# Patient Record
Sex: Male | Born: 1960 | Race: White | Hispanic: No | Marital: Single | Smoking: Current every day smoker
Health system: Southern US, Community
[De-identification: ages and names within clinical notes are randomized; demographics above are authoritative.]

## PROBLEM LIST (undated history)

## (undated) DIAGNOSIS — F329 Major depressive disorder, single episode, unspecified: Secondary | ICD-10-CM

## (undated) DIAGNOSIS — F32A Depression, unspecified: Secondary | ICD-10-CM

## (undated) DIAGNOSIS — I729 Aneurysm of unspecified site: Secondary | ICD-10-CM

## (undated) HISTORY — DX: Aneurysm of unspecified site: I72.9

## (undated) HISTORY — PX: EYE SURGERY: SHX253

## (undated) HISTORY — PX: MOUTH SURGERY: SHX715

## (undated) HISTORY — DX: Major depressive disorder, single episode, unspecified: F32.9

## (undated) HISTORY — DX: Depression, unspecified: F32.A

---

## 2003-02-10 ENCOUNTER — Ambulatory Visit (HOSPITAL_COMMUNITY): Admission: RE | Admit: 2003-02-10 | Discharge: 2003-02-10 | Payer: Self-pay | Admitting: Ophthalmology

## 2003-03-03 ENCOUNTER — Ambulatory Visit (HOSPITAL_COMMUNITY): Admission: RE | Admit: 2003-03-03 | Discharge: 2003-03-03 | Payer: Self-pay | Admitting: Ophthalmology

## 2010-12-10 ENCOUNTER — Emergency Department (HOSPITAL_COMMUNITY)
Admission: EM | Admit: 2010-12-10 | Discharge: 2010-12-10 | Disposition: A | Discharge status: Home / Self Care | Payer: Self-pay | Attending: Emergency Medicine | Admitting: Emergency Medicine

## 2010-12-10 DIAGNOSIS — K5641 Fecal impaction: Secondary | ICD-10-CM | POA: Insufficient documentation

## 2010-12-10 DIAGNOSIS — K5900 Constipation, unspecified: Secondary | ICD-10-CM | POA: Insufficient documentation

## 2017-11-08 ENCOUNTER — Encounter (HOSPITAL_COMMUNITY): Payer: Self-pay | Admitting: Emergency Medicine

## 2017-11-08 ENCOUNTER — Other Ambulatory Visit: Payer: Self-pay

## 2017-11-08 ENCOUNTER — Emergency Department (HOSPITAL_COMMUNITY)
Admission: EM | Admit: 2017-11-08 | Discharge: 2017-11-08 | Disposition: A | Discharge status: Home / Self Care | Payer: Self-pay | Attending: Emergency Medicine | Admitting: Emergency Medicine

## 2017-11-08 DIAGNOSIS — F17210 Nicotine dependence, cigarettes, uncomplicated: Secondary | ICD-10-CM | POA: Insufficient documentation

## 2017-11-08 DIAGNOSIS — L0291 Cutaneous abscess, unspecified: Secondary | ICD-10-CM

## 2017-11-08 DIAGNOSIS — L0201 Cutaneous abscess of face: Secondary | ICD-10-CM | POA: Insufficient documentation

## 2017-11-08 MED ORDER — CLINDAMYCIN HCL 150 MG PO CAPS
300.0000 mg | ORAL_CAPSULE | Freq: Once | ORAL | Status: AC
  Administered 2017-11-08: 300 mg via ORAL
  Filled 2017-11-08: qty 2

## 2017-11-08 MED ORDER — CLINDAMYCIN HCL 300 MG PO CAPS: 300.0000 mg | ORAL_CAPSULE | Freq: Four times a day (QID) | ORAL | 0 refills | Status: AC

## 2017-11-08 MED ORDER — IBUPROFEN 600 MG PO TABS: 600.0000 mg | ORAL_TABLET | Freq: Four times a day (QID) | ORAL | 0 refills | Status: DC | PRN

## 2017-11-08 NOTE — ED Provider Notes (Signed)
[facility] EMERGENCY DEPARTMENT Provider Note   CSN: [id number] Arrival date & time: 11/08/17  1117     History   Chief Complaint Chief Complaint  Patient presents with  . Abscess    HPI Stephen Valentine is a 57 y.o. male.  The history is provided by the patient.  Abscess  Location:  Head/neck and face Facial abscess location:  Chin Size:  1 cm Abscess quality: draining, induration and painful   Abscess quality: no fluctuance and no itching   Progression:  Worsening Pain details:    Quality:  Aching   Severity:  Mild Chronicity:  Recurrent (Patient reports has had this symptom in the past which has resolved and now returned.) Context: not diabetes and not skin injury   Relieved by:  Draining/squeezing Worsened by:  Nothing Associated symptoms: no anorexia, no fever, no nausea and no vomiting   Risk factors: no family hx of MRSA and no hx of MRSA     No past medical history on file.  There are no active problems to display for this patient.   Past Surgical History:  Procedure Laterality Date  . MOUTH SURGERY         Home Medications    Prior to Admission medications   Medication Sig Start Date End Date Taking? Authorizing Provider  clindamycin (CLEOCIN) 300 MG capsule Take 1 capsule (300 mg total) by mouth 4 (four) times daily for 7 days. 11/08/17 11/15/17  Burgess Amor, PA-C  ibuprofen (ADVIL,MOTRIN) 600 MG tablet Take 1 tablet (600 mg total) by mouth every 6 (six) hours as needed for moderate pain. 11/08/17   Burgess Amor, PA-C    Family History No family history on file.  Social History Social History   Tobacco Use  . Smoking status: Current Every Day Smoker    Packs/day: 1.00    Types: Cigarettes  . Smokeless tobacco: Never Used  Substance Use Topics  . Alcohol use: No    Frequency: Never  . Drug use: No     Allergies   Penicillins   Review of Systems Review of Systems  Constitutional: Negative for chills and fever.  HENT: Negative.     Respiratory: Negative.   Gastrointestinal: Negative.  Negative for anorexia, nausea and vomiting.  Skin: Positive for wound.  Neurological: Negative for numbness.     Physical Exam Updated Vital Signs BP 102/82 (BP Location: Right Arm)   Pulse 87   Temp 98.4 F (36.9 C) (Oral)   Resp 20   Ht 5\' 10"  (1.778 m)   Wt 68 kg (150 lb)   SpO2 97%   BMI 21.52 kg/m   Physical Exam  Constitutional: He appears well-developed and well-nourished. No distress.  HENT:  Head: Normocephalic.  Mouth/Throat: No dental abscesses.  Poor dentition, mostly edentulous.  He has his inferior central and lateral incisors which have moderate decay.  There is no obvious dental abscess.  Neck: Neck supple.  Cardiovascular: Normal rate.  Pulmonary/Chest: Effort normal. He has no wheezes.  Musculoskeletal: Normal range of motion. He exhibits no edema.  Skin: No rash noted. There is erythema.  Open purulent draining small abscess on left chin within beard.  No fluctuance.     ED Treatments / Results  Labs (all labs ordered are listed, but only abnormal results are displayed) Labs Reviewed - No data to display  EKG  EKG Interpretation None       Radiology No results found.  Procedures Procedures (including critical care time)  Medications Ordered in ED Medications  clindamycin (CLEOCIN) capsule 300 mg (300 mg Oral Given 11/08/17 1303)     Initial Impression / Assessment and Plan / ED Course  I have reviewed the triage vital signs and the nursing notes.  Pertinent labs & imaging results that were available during my care of the patient were reviewed by me and considered in my medical decision making (see chart for details).     Patient with draining abscess, no indication for further I&D today.  He was placed on clindamycin and prescribed ibuprofen.  Patient has no insurance and transportation or ability for follow-up care outside of the ED.  He was given a referral to [facility] and also had case management give resource information to the patient including a voucher to get his medications filled today.  He was placed on clindamycin given his penicillin allergy.  First dose was given today.  Advised that he needs to shave his beard or at least the area around the site of infection so that he can keep it clean and continue to observe it.  Discussed warm water soaks along with soap and water wash.  Advised to follow-up here for recheck for any worsening symptoms.  Final Clinical Impressions(s) / ED Diagnoses   Final diagnoses:  Abscess    ED Discharge Orders        Ordered    clindamycin (CLEOCIN) 300 MG capsule  4 times daily     11/08/17 1348    ibuprofen (ADVIL,MOTRIN) 600 MG tablet  Every 6 hours PRN     11/08/17 1349       Burgess Amordol, Elwin Tsou, PA-C 11/08/17 1606    Long, Arlyss RepressJoshua G, MD 11/08/17 1941

## 2017-11-08 NOTE — ED Triage Notes (Signed)
Abscess on chin, 2 weeks ago, got yellow pus out, getting tight a tender again

## 2017-11-08 NOTE — Care Management (Signed)
[facility] and resources for follow [facility]

## 2017-11-08 NOTE — Discharge Instructions (Signed)
Take the entire course of the antibiotics prescribed. Apply a warm water soak using a washcloth for 3 times daily to this wound site.  You would benefit from shaving your beard so you can keep a closer eye on this infection and so you can more easily keep it clean.  You will need to wash the site with soap and warm water twice daily.

## 2018-01-22 NOTE — Congregational Nurse Program (Signed)
Congregational Nurse Program Note  Date of Encounter: 01/21/2018  Past Medical History: No past medical history on file.  Encounter Details: CNP Questionnaire - 01/21/18 1500      Questionnaire   Patient Status  Not Applicable    Race  White or Caucasian    Location Patient Served At  [facility]  Not Applicable    Uninsured  Uninsured (NEW 1x/quarter)    Food  No food insecurities    Housing/Utilities  Yes, have permanent housing    Transportation  Yes, need transportation assistance;Provided transportation assistance (bus pass, taxi voucher, etc.)    Interpersonal Safety  Yes, feel physically and emotionally safe where you currently live    Medication  Yes, have medication insecurities    Referrals  Other;Primary Care Provider/Clinic    ED Visit Averted  Not Applicable    Life-Saving Intervention Made  Not Applicable       New client to [facility] today as a walk in. Client was last seen in [facility] room in January 2019. He was seen and treated for dental pain and abscess. He was treated by antibiotics and assisted with payment through [facility] match program. Client states he completed the medication, but now is having "twinges" of pain in his mouth. Noted from ER note that client has multiple areas of decay of his teeth.  Social: client does not have income currently and does not have health insurance. Client does receive food stamps. He is currently living in a camper [place] his [place] property. He reports he has running water and a place to cook with hot plate and microwave. He states he does have heat from a portable electric heater. Client does report feeling safe where he is living at this time. Client's mode of transportation is mainly by bicycle. He is here today requesting help navigating into a primary care provider where he can get care for his health and dental care.   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 Mental health    Alert and oriented to person,place and time. Answers questions appropriately but vague on some issues and doesn't remember. He has traveled around.  Chief complaints today are dental pain that he reports as twinges. Recent treatment of dental abscess in 11/2017 Complains of exhaustion, dizziness, insomnia. Blood pressure today 105/68, pulse 82, glucose 80. ( has only eaten a snack cake today). Question if client is eating properly. No labs available in Epic for review. Denies Chest pains or shortness of breath. Lungs clear. Respirations non labored at 12. Denies abdominal pain at present. Client offered water and crackers while here and declined. Will send some home with client today. Does report lower back pain that at times radiates down both legs, he states he has had this issue for a while. He reports swelling of his feet with long periods of standing.  Discussed with client about options for medical care that would then be able to refer him for dental care and length of time that would take. Client states understanding and would like to proceed with referral to the [facility] and appointment secured for next week to allow for [facility] transportation. Appointment secured for 01/29/18 at 1:00 PM. [facility] arranged for medical appointment and will pick client up at 1130 am on 01/29/18. Client also referred to Social work intern Shanon Payor who will arrange to meet with client  after his medical appointment at the [facility] clinic on 01/29/18. Social work Tax inspectorintern to address: Camera operatorhone application with [employer] stamps Housing needs.  Mental health needs and assessment.  Client agreeable to plan and will follow as needed.

## 2018-01-29 ENCOUNTER — Encounter: Payer: Self-pay | Admitting: Physician Assistant

## 2018-01-29 ENCOUNTER — Ambulatory Visit: Payer: Self-pay | Admitting: Physician Assistant

## 2018-01-29 VITALS — BP 127/77 | HR 64 | Temp 98.2°F | Ht 69.0 in | Wt 145.5 lb

## 2018-01-29 DIAGNOSIS — Z125 Encounter for screening for malignant neoplasm of prostate: Secondary | ICD-10-CM

## 2018-01-29 DIAGNOSIS — F17219 Nicotine dependence, cigarettes, with unspecified nicotine-induced disorders: Secondary | ICD-10-CM

## 2018-01-29 DIAGNOSIS — Z131 Encounter for screening for diabetes mellitus: Secondary | ICD-10-CM

## 2018-01-29 DIAGNOSIS — Z13220 Encounter for screening for lipoid disorders: Secondary | ICD-10-CM

## 2018-01-29 DIAGNOSIS — K029 Dental caries, unspecified: Secondary | ICD-10-CM

## 2018-01-29 DIAGNOSIS — R634 Abnormal weight loss: Secondary | ICD-10-CM

## 2018-01-29 DIAGNOSIS — R1310 Dysphagia, unspecified: Secondary | ICD-10-CM

## 2018-01-29 MED ORDER — OMEPRAZOLE 40 MG PO CPDR: 40.0000 mg | DELAYED_RELEASE_CAPSULE | Freq: Every day | ORAL | 1 refills | Status: DC

## 2018-01-29 NOTE — Patient Instructions (Addendum)
1. Get fasting labwork done. 2. Medication (omeprazole) will come in the mail 3. [facility] in [facility] 4. Stop smoking 5. Gastroenterology office will be contacting you for an appointment 6. Bring back Letter of Support and [place] bill for proof of address  ________________________________________________________    Steps to Quit Smoking Smoking tobacco can be harmful to your health and can affect almost every organ in your body. Smoking puts you, and those around you, at risk for developing many serious chronic diseases. Quitting smoking is difficult, but it is one of the best things that you can do for your health. It is never too late to quit. What are the benefits of quitting smoking? When you quit smoking, you lower your risk of developing serious diseases and conditions, such as:  Lung cancer or lung disease, such as COPD.  Heart disease.  Stroke.  Heart attack.  Infertility.  Osteoporosis and bone fractures.  Additionally, symptoms such as coughing, wheezing, and shortness of breath may get better when you quit. You may also find that you get sick less often because your body is stronger at fighting off colds and infections. If you are pregnant, quitting smoking can help to reduce your chances of having a baby of low birth weight. How do I get ready to quit? When you decide to quit smoking, create a plan to make sure that you are successful. Before you quit:  Pick a date to quit. Set a date within the next two weeks to give you time to prepare.  Write down the reasons why you are quitting. Keep this list in places where you will see it often, such as on your bathroom mirror or in your car or wallet.  Identify the people, places, things, and activities that make you want to smoke (triggers) and avoid them. Make sure to take these actions: ? Throw away all cigarettes at home, at work, and in your car. ? Throw away smoking accessories, such as Air traffic controllerashtrays  and lighters. ? Clean your car and make sure to empty the ashtray. ? Clean your home, including curtains and carpets.  Tell your family, friends, and coworkers that you are quitting. Support from your loved ones can make quitting easier.  Talk with your health care provider about your options for quitting smoking.  Find out what treatment options are covered by your health insurance.  What strategies can I use to quit smoking? Talk with your healthcare provider about different strategies to quit smoking. Some strategies include:  Quitting smoking altogether instead of gradually lessening how much you smoke over a period of time. Research shows that quitting "cold [place]urkey" is more successful than gradually quitting.  Attending in-person counseling to help you build problem-solving skills. You are more likely to have success in quitting if you attend several counseling sessions. Even short sessions of 10 minutes can be effective.  Finding resources and support systems that can help you to quit smoking and remain smoke-free after you quit. These resources are most helpful when you use them often. They can include: ? Online chats with a Veterinary surgeoncounselor. ? Telephone quitlines. ? Automotive engineerrinted self-help materials. ? Support groups or group counseling. ? Text messaging programs. ? Mobile phone applications.  Taking medicines to help you quit smoking. (If you are pregnant or breastfeeding, talk with your health care provider first.) Some medicines contain nicotine and some do not. Both types of medicines help with cravings, but the medicines that include nicotine help to relieve withdrawal  symptoms. Your health care provider may recommend: ? Nicotine patches, gum, or lozenges. ? Nicotine inhalers or sprays. ? Non-nicotine medicine that is taken by mouth.  Talk with your health care provider about combining strategies, such as taking medicines while you are also receiving in-person counseling. Using these  two strategies together makes you more likely to succeed in quitting than if you used either strategy on its own. If you are pregnant or breastfeeding, talk with your health care provider about finding counseling or other support strategies to quit smoking. Do not take medicine to help you quit smoking unless told to do so by your health care provider. What things can I do to make it easier to quit? Quitting smoking might feel overwhelming at first, but there is a lot that you can do to make it easier. Take these important actions:  Reach out to your family and friends and ask that they support and encourage you during this time. Call telephone quitlines, reach out to support groups, or work with a counselor for support.  Ask people who smoke to avoid smoking around you.  Avoid places that trigger you to smoke, such as bars, parties, or smoke-break areas at work.  Spend time around people who do not smoke.  Lessen stress in your life, because stress can be a smoking trigger for some people. To lessen stress, try: ? Exercising regularly. ? Deep-breathing exercises. ? Yoga. ? Meditating. ? Performing a body scan. This involves closing your eyes, scanning your body from head to toe, and noticing which parts of your body are particularly tense. Purposefully relax the muscles in those areas.  Download or purchase mobile phone or tablet apps (applications) that can help you stick to your quit plan by providing reminders, tips, and encouragement. There are many free apps, such as QuitGuide from the [employer] developer for Disease Control and Prevention). You can find other support for quitting smoking (smoking cessation) through smokefree.gov and other websites.  How will I feel when I quit smoking? Within the first 24 hours of quitting smoking, you may start to feel some withdrawal symptoms. These symptoms are usually most noticeable 2-3 days after quitting, but they usually do not last beyond 2-3 weeks.  Changes or symptoms that you might experience include:  Mood swings.  Restlessness, anxiety, or irritation.  Difficulty concentrating.  Dizziness.  Strong cravings for sugary foods in addition to nicotine.  Mild weight gain.  Constipation.  Nausea.  Coughing or a sore throat.  Changes in how your medicines work in your body.  A depressed mood.  Difficulty sleeping (insomnia).  After the first 2-3 weeks of quitting, you may start to notice more positive results, such as:  Improved sense of smell and taste.  Decreased coughing and sore throat.  Slower heart rate.  Lower blood pressure.  Clearer skin.  The ability to breathe more easily.  Fewer sick days.  Quitting smoking is very challenging for most people. Do not get discouraged if you are not successful the first time. Some people need to make many attempts to quit before they achieve long-term success. Do your best to stick to your quit plan, and talk with your health care provider if you have any questions or concerns. This information is not intended to replace advice given to you by your health care provider. Make sure you discuss any questions you have with your health care provider. Document Released: 10/17/2001 Document Revised: 06/20/2016 Document Reviewed: 03/09/2015 Elsevier Interactive Patient Education  2018  [employer].

## 2018-01-29 NOTE — Progress Notes (Signed)
BP 127/77 (BP Location: Right Arm, Patient Position: Sitting, Cuff Size: Normal)   Pulse 64   Temp 98.2 F (36.8 C)   Ht 5\' 9"  (1.753 m)   Wt 145 lb 8 oz (66 kg)   SpO2 98%   BMI 21.49 kg/m    Subjective:    Patient ID: Stephen Valentine, male    DOB: [birth year], 57 y.o.   MRN: [id number]  HPI: Stephen Valentine is a 57 y.o. male presenting on 01/29/2018 for New Patient (Initial Visit)   HPI   Chief Complaint  Patient presents with  . New Patient (Initial Visit)      Pt having dysphagia for several years- wax and wane- getting worse.  He says his voice feels like it is slurring- he doesn't know if that is due to his throat or his teeth.  He says food feels like it's getting stuck in his throat    Pt moved back to [facility] 3 years ago from [place].      Pt also complains of chronic insomnia.   Pt states weight loss of about 85 pounds over past few years.  He says he used to weigh 230 pounds.  He has not been trying to lose weight.   Pt was in hospital for aneurysm approximately 2005 in [place].  He doesn't know if it was in his brain or abdomen or where.  He doesn't even know what town in [place] he was.   He doesn't think he had surgery for it.   Pt started smoking when he was 57yo.   He will have some wheezes occasionally in the morning upon rising but otherwise denies wheezes, sob.     Relevant past medical, surgical, family and social history reviewed and updated as indicated. Interim medical history since our last visit reviewed. Allergies and medications reviewed and updated.  No current outpatient medications on file.   Review of Systems  Constitutional: Positive for fatigue. Negative for appetite change, chills, diaphoresis, fever and unexpected weight change.  HENT: Positive for dental problem and trouble swallowing. Negative for congestion, drooling, ear pain, facial swelling, hearing loss, mouth sores, sneezing, sore throat and voice change.   Eyes:  Negative for pain, discharge, redness, itching and visual disturbance.  Respiratory: Negative for cough, choking, shortness of breath and wheezing.   Cardiovascular: Negative for chest pain, palpitations and leg swelling.  Gastrointestinal: Negative for abdominal pain, blood in stool, constipation, diarrhea and vomiting.  Endocrine: Positive for cold intolerance and heat intolerance. Negative for polydipsia.  Genitourinary: Negative for decreased urine volume, dysuria and hematuria.  Musculoskeletal: Positive for arthralgias and back pain. Negative for gait problem.  Skin: Negative for rash.  Allergic/Immunologic: Negative for environmental allergies.  Neurological: Positive for light-headedness. Negative for seizures, syncope and headaches.  Hematological: Negative for adenopathy.  Psychiatric/Behavioral: Negative for agitation, dysphoric mood and suicidal ideas. The patient is not nervous/anxious.     Per HPI unless specifically indicated above     Objective:    BP 127/77 (BP Location: Right Arm, Patient Position: Sitting, Cuff Size: Normal)   Pulse 64   Temp 98.2 F (36.8 C)   Ht 5\' 9"  (1.753 m)   Wt 145 lb 8 oz (66 kg)   SpO2 98%   BMI 21.49 kg/m   Wt Readings from Last 3 Encounters:  01/29/18 145 lb 8 oz (66 kg)  01/21/18 150 lb (68 kg)  11/08/17 150 lb (68 kg)    Physical Exam  Constitutional: He is oriented to person, place, and time. Vital signs are normal. He is cooperative.  Thin frail pleasant gentleman who appears older than stated age.  Very strong smell of cigarettes.  HENT:  Head: Normocephalic and atraumatic.  Right Ear: A foreign body is present.  Left Ear: A foreign body is present.  Nose: Nasal deformity present.  Mouth/Throat: Oropharynx is clear and moist. No trismus in the jaw. Abnormal dentition. No uvula swelling. No oropharyngeal exudate.  Cerumen bilaterlally.  Only teeth present are lower front teeth which are all significantly decayed. No abscess  seen.    Eyes: Pupils are equal, round, and reactive to light. Conjunctivae and EOM are normal.  Neck: Neck supple. No thyromegaly present.  Cardiovascular: Normal rate and regular rhythm.  Pulmonary/Chest: Effort normal and breath sounds normal. No respiratory distress. He has no wheezes. He has no rales.  Abdominal: Soft. Bowel sounds are normal. He exhibits no distension, no fluid wave, no ascites, no pulsatile midline mass and no mass. There is no hepatosplenomegaly. There is no tenderness.  Musculoskeletal: He exhibits no edema.  Lymphadenopathy:    He has no cervical adenopathy.  Neurological: He is alert and oriented to person, place, and time.  Skin: Skin is warm and dry. No rash noted.  Psychiatric: He has a normal mood and affect. His behavior is normal. Thought content normal. His speech is slurred.  Pt talks through his nose somewhat.  He doesn't know if he slurs due to his teeth or his nose issue.   Vitals reviewed.    No results found for this or any previous visit.    Assessment & Plan:   Encounter Diagnoses  Name Primary?  Stephen Valentine. Dysphagia, unspecified type Yes  . Weight loss   . Cigarette nicotine dependence with nicotine-induced disorder   . Screening cholesterol level   . Screening for diabetes mellitus   . Screening for prostate cancer   . Dental decay      -will get Baseline labs -rx Omeprazole -will sign up pt for medassist since he is unable to afford his medications -Refer to gastroenterology for dysphagia x 3 years with weight loss and long history of smoking -pt was [facility] -pt was put on Dental list -counseled pt on Smoking cessation -pt to follow up here 1 month.  RTO sooner prn

## 2018-01-30 ENCOUNTER — Telehealth: Payer: Self-pay

## 2018-01-30 ENCOUNTER — Encounter: Payer: Self-pay | Admitting: Internal Medicine

## 2018-01-30 NOTE — Telephone Encounter (Signed)
[facility] clinic called after client's appointment. Client has an upcoming appointment for follow up with the [facility] on 03/05/18 at 2:00 PM. [facility] arranged by RN and will pick up client at or around 1:00 PM.  Client also in need of lab work. [facility] arranged for 02/06/18 pickup at 0800 to take to [facility] for [place] at 0900.   The [facility] clinic staff Stephen Valentine wrote down all appointment dates for medical and lab with specific pick up times for [facility] at gave to the client before he left the clinic. Client does not currently have a phone.    *NOTE . CSWEI  BSW intern Stephen Valentine met with client after his appointment at the [facility] clinic for needs and risk assessment. BSW intern also assisted client in applying for a lifeline phone through [employer]. Application completed online with client and phone should be delivered to his home within the next couple of weeks.    [place] [phone]

## 2018-01-30 NOTE — Telephone Encounter (Signed)
[facility]. While pt was on the phone asked pt how his appointment with the [facility] went. Pt stated it went well. Says he has his labs will be drawn next week. Aplogized to pt about [facility] not taking him home yesterday due to incorrect address. Told pt we have corrected his address at [facility], and should have no issues next week. Pt thankful.   Lougenia Morrissey R. Guthrie Lemme LPN [phone]

## 2018-02-06 ENCOUNTER — Other Ambulatory Visit (HOSPITAL_COMMUNITY)
Admission: RE | Admit: 2018-02-06 | Discharge: 2018-02-06 | Disposition: A | Discharge status: Home / Self Care | Payer: Self-pay | Source: Ambulatory Visit | Attending: Physician Assistant | Admitting: Physician Assistant

## 2018-02-06 DIAGNOSIS — R634 Abnormal weight loss: Secondary | ICD-10-CM | POA: Insufficient documentation

## 2018-02-06 DIAGNOSIS — R1310 Dysphagia, unspecified: Secondary | ICD-10-CM | POA: Insufficient documentation

## 2018-02-06 DIAGNOSIS — Z13220 Encounter for screening for lipoid disorders: Secondary | ICD-10-CM | POA: Insufficient documentation

## 2018-02-06 DIAGNOSIS — Z125 Encounter for screening for malignant neoplasm of prostate: Secondary | ICD-10-CM | POA: Insufficient documentation

## 2018-02-06 DIAGNOSIS — Z131 Encounter for screening for diabetes mellitus: Secondary | ICD-10-CM | POA: Insufficient documentation

## 2018-02-06 LAB — COMPREHENSIVE METABOLIC PANEL
ALBUMIN: 4.4 g/dL (ref 3.5–5.0)
ALT: 21 U/L (ref 17–63)
AST: 27 U/L (ref 15–41)
Alkaline Phosphatase: 62 U/L (ref 38–126)
Anion gap: 12 (ref 5–15)
BUN: 9 mg/dL (ref 6–20)
CHLORIDE: 102 mmol/L (ref 101–111)
CO2: 28 mmol/L (ref 22–32)
CREATININE: 1.24 mg/dL (ref 0.61–1.24)
Calcium: 9.8 mg/dL (ref 8.9–10.3)
GFR calc Af Amer: >60 mL/min (ref >60)
GFR calc non Af Amer: >60 mL/min (ref >60)
Glucose, Bld: 96 mg/dL (ref 65–99)
Potassium: 4.5 mmol/L (ref 3.5–5.1)
Sodium: 142 mmol/L (ref 135–145)
Total Bilirubin: 1.2 mg/dL (ref 0.3–1.2)
Total Protein: 7.9 g/dL (ref 6.5–8.1)

## 2018-02-06 LAB — CBC WITH DIFFERENTIAL/PLATELET
Basophils Absolute: 0 10*3/uL (ref 0.0–0.1)
Basophils Relative: 1 %
EOS ABS: 0.2 10*3/uL (ref 0.0–0.7)
Eosinophils Relative: 3 %
HCT: 49.8 % (ref 39.0–52.0)
HEMOGLOBIN: 16.3 g/dL (ref 13.0–17.0)
LYMPHS ABS: 2 10*3/uL (ref 0.7–4.0)
LYMPHS PCT: 30 %
MCH: 30.9 pg (ref 26.0–34.0)
MCHC: 32.7 g/dL (ref 30.0–36.0)
MCV: 94.5 fL (ref 78.0–100.0)
Monocytes Absolute: 0.5 10*3/uL (ref 0.1–1.0)
Monocytes Relative: 7 %
Neutro Abs: 4 10*3/uL (ref 1.7–7.7)
Neutrophils Relative %: 59 %
Platelets: 169 10*3/uL (ref 150–400)
RBC: 5.27 MIL/uL (ref 4.22–5.81)
RDW: 13.2 % (ref 11.5–15.5)
WBC: 6.7 10*3/uL (ref 4.0–10.5)

## 2018-02-06 LAB — LIPID PANEL
CHOLESTEROL: 199 mg/dL (ref 0–200)
HDL: 68 mg/dL (ref >40)
LDL Cholesterol: 125 mg/dL — ABNORMAL HIGH (ref 0–99)
Total CHOL/HDL Ratio: 2.9 RATIO
Triglycerides: 31 mg/dL (ref <150)
VLDL: 6 mg/dL (ref 0–40)

## 2018-02-06 LAB — TSH: TSH: 2.843 u[IU]/mL (ref 0.350–4.500)

## 2018-02-06 LAB — PSA: Prostatic Specific Antigen: 0.71 ng/mL (ref 0.00–4.00)

## 2018-02-06 LAB — HEMOGLOBIN A1C
HEMOGLOBIN A1C: 5.4 % (ref 4.8–5.6)
MEAN PLASMA GLUCOSE: 108.28 mg/dL

## 2018-02-13 NOTE — Congregational Nurse Program (Signed)
Congregational Nurse Program Note  Date of Encounter: 02/13/2018  Past Medical History: Past Medical History:  Diagnosis Date  . Aneurysm (HCC)   . Depression     Encounter Details: CNP Questionnaire - 02/13/18 1603      Questionnaire   Patient Status  Not Applicable    Race  White or Caucasian    [facility]    Insurance  Not Applicable    Uninsured  Uninsured (Subsequent visits/quarter)    Food  No food insecurities    Housing/Utilities  Yes, have permanent housing    Transportation  Yes, need transportation assistance;Provided transportation assistance (bus pass, taxi voucher, etc.)    Interpersonal Safety  Yes, feel physically and emotionally safe where you currently live    Medication  Yes, have medication insecurities    Medical Provider  Yes    Referrals  Other;[facility]    ED Visit Averted  Not Applicable    Life-Saving Intervention Made  Not Applicable       Client walked in today inquiring about Assurance phone that [employer]ander Scott BSW intern had assisted him with application.  RN explained that client had accidentally given the wrong address and that the application had to be canceled and then reapply. Earley AbideBlanca Rangel LPN started another application for a phone today online with client and Shanon PayorSander Scott BSW intern will follow up regarding this next week by phone. Client agreeable.  Client also needed transportation for upcoming GI appointment. For 04/11/18. [facility] called and transportation arranged and pickup time written on patients appointment letter.  Verified previously arranged transportation with [facility] for upcoming appointments.  Client also had attempted to meet with financial counselor Knox RoyaltyIngrid Costner [facility] but states she was not there. Client had rode bicycle from [place] area to town to see her. RN called Knox RoyaltyIngrid Costner and she will see client today if he will come by. Client informed to knock on her door  and let her know his name. Client agreeable.  No further needs at this time. Will follow as needed.  Francee NodalPatricia R Axton Cihlar RN [phone]

## 2018-03-05 ENCOUNTER — Ambulatory Visit: Payer: Self-pay | Admitting: Physician Assistant

## 2018-03-05 ENCOUNTER — Encounter: Payer: Self-pay | Admitting: Physician Assistant

## 2018-03-05 VITALS — BP 90/62 | HR 71 | Temp 97.9°F | Ht 69.0 in | Wt 142.8 lb

## 2018-03-05 DIAGNOSIS — R634 Abnormal weight loss: Secondary | ICD-10-CM | POA: Insufficient documentation

## 2018-03-05 DIAGNOSIS — M545 Low back pain, unspecified: Secondary | ICD-10-CM

## 2018-03-05 DIAGNOSIS — K029 Dental caries, unspecified: Secondary | ICD-10-CM

## 2018-03-05 DIAGNOSIS — R195 Other fecal abnormalities: Secondary | ICD-10-CM | POA: Insufficient documentation

## 2018-03-05 DIAGNOSIS — F17219 Nicotine dependence, cigarettes, with unspecified nicotine-induced disorders: Secondary | ICD-10-CM | POA: Insufficient documentation

## 2018-03-05 DIAGNOSIS — R1310 Dysphagia, unspecified: Secondary | ICD-10-CM | POA: Insufficient documentation

## 2018-03-05 DIAGNOSIS — E785 Hyperlipidemia, unspecified: Secondary | ICD-10-CM | POA: Insufficient documentation

## 2018-03-05 NOTE — Progress Notes (Signed)
BP 90/62 (BP Location: Left Arm, Patient Position: Sitting, Cuff Size: Normal)   Pulse 71   Temp 97.9 F (36.6 C)   Ht  (1.753 m)   Wt 142 lb 12 oz (64.8 kg)   SpO2 97%   BMI 21.08 kg/m    Subjective:    Patient ID: Stephen Valentine, male    DOB: 1961-10-23, 57 y.o.   MRN: [id number]  HPI: Stephen Valentine is a 57 y.o. male presenting on 03/05/2018 for Follow-up   HPI   Pt has appt with GI first week of June for dyspepsia, weight loss and + occult fecal blood.    Pt is taking the omeprazole and says that is helping some.   Pt is still smoking.  He still has some SOB that comes and goes but says he mostly has SOB when he is trying to eat or drink.   Pt says he has a place above his rectum that hurts from time to time.  It has been there for several years- he says it comes and goes.  He says he had some xrays for that in [place] one time but he doesn't know what they showed.    Pt has not yet submitted [facility].  He says he is waiting for some tax papers  Pt has lost 3 more pounds since his initial appointment a month ago.  Relevant past medical, surgical, family and social history reviewed and updated as indicated. Interim medical history since our last visit reviewed. Allergies and medications reviewed and updated.   Current Outpatient Medications:  .  omeprazole (PRILOSEC) 40 MG capsule, Take 1 capsule (40 mg total) by mouth daily., Disp: 90 capsule, Rfl: 1  Review of Systems  Constitutional: Positive for appetite change, chills and fatigue. Negative for diaphoresis, fever and unexpected weight change.  HENT: Positive for dental problem and trouble swallowing. Negative for congestion, drooling, ear pain, facial swelling, hearing loss, mouth sores, sneezing, sore throat and voice change.   Eyes: Positive for redness and visual disturbance. Negative for pain, discharge and itching.  Respiratory: Positive for choking. Negative for cough,  shortness of breath and wheezing.   Cardiovascular: Negative for chest pain, palpitations and leg swelling.  Gastrointestinal: Negative for abdominal pain, blood in stool, constipation, diarrhea and vomiting.  Endocrine: Positive for cold intolerance, heat intolerance and polydipsia.  Genitourinary: Negative for decreased urine volume, dysuria and hematuria.  Musculoskeletal: Positive for arthralgias and back pain. Negative for gait problem.  Skin: Negative for rash.  Allergic/Immunologic: Negative for environmental allergies.  Neurological: Negative for seizures, syncope, light-headedness and headaches.  Hematological: Negative for adenopathy.  Psychiatric/Behavioral: Negative for agitation, dysphoric mood and suicidal ideas. The patient is not nervous/anxious.     Per HPI unless specifically indicated above     Objective:    BP 90/62 (BP Location: Left Arm, Patient Position: Sitting, Cuff Size: Normal)   Pulse 71   Temp 97.9 F (36.6 C)   Ht  (1.753 m)   Wt 142 lb 12 oz (64.8 kg)   SpO2 97%   BMI 21.08 kg/m   Wt Readings from Last 3 Encounters:  03/05/18 142 lb 12 oz (64.8 kg)  01/29/18 145 lb 8 oz (66 kg)  01/21/18 150 lb (68 kg)    Physical Exam  Constitutional: He is oriented to person, place, and time. He appears well-developed and well-nourished.  HENT:  Head: Normocephalic and atraumatic.  Neck: Neck supple.  Cardiovascular:  Normal rate and regular rhythm.  Pulmonary/Chest: Effort normal and breath sounds normal. He has no wheezes.  Abdominal: Soft. Bowel sounds are normal. There is no hepatosplenomegaly. There is no tenderness.  Genitourinary: Rectum normal. Rectal exam shows no external hemorrhoid, no internal hemorrhoid, no fissure, no mass and no tenderness.  Genitourinary Comments: (nurse berenice assisted)  Musculoskeletal: He exhibits no edema.       Lumbar back: Normal. He exhibits normal range of motion, no tenderness and no bony tenderness.   Lymphadenopathy:    He has no cervical adenopathy.  Neurological: He is alert and oriented to person, place, and time.  Skin: Skin is warm and dry.  Psychiatric: He has a normal mood and affect. His behavior is normal.  Vitals reviewed.   Results for orders placed or performed during the hospital encounter of 02/06/18  Hemoglobin A1c  Result Value Ref Range   Hgb A1c MFr Bld 5.4 4.8 - 5.6 %   Mean Plasma Glucose 108.28 mg/dL  Lipid panel  Result Value Ref Range   Cholesterol 199 0 - 200 mg/dL   Triglycerides 31 <409 mg/dL   HDL 68 >81 mg/dL   Total CHOL/HDL Ratio 2.9 RATIO   VLDL 6 0 - 40 mg/dL   LDL Cholesterol 191 (H) 0 - 99 mg/dL  PSA  Result Value Ref Range   Prostatic Specific Antigen 0.71 0.00 - 4.00 ng/mL  TSH  Result Value Ref Range   TSH 2.843 0.350 - 4.500 uIU/mL  Comprehensive metabolic panel  Result Value Ref Range   Sodium 142 135 - 145 mmol/L   Potassium 4.5 3.5 - 5.1 mmol/L   Chloride 102 101 - 111 mmol/L   CO2 28 22 - 32 mmol/L   Glucose, Bld 96 65 - 99 mg/dL   BUN 9 6 - 20 mg/dL   Creatinine, Ser 4.78 0.61 - 1.24 mg/dL   Calcium 9.8 8.9 - 29.5 mg/dL   Total Protein 7.9 6.5 - 8.1 g/dL   Albumin 4.4 3.5 - 5.0 g/dL   AST 27 15 - 41 U/L   ALT 21 17 - 63 U/L   Alkaline Phosphatase 62 38 - 126 U/L   Total Bilirubin 1.2 0.3 - 1.2 mg/dL   GFR calc non Af Amer >60 >60 mL/min   GFR calc Af Amer >60 >60 mL/min   Anion gap 12 5 - 15  CBC w/Diff/Platelet  Result Value Ref Range   WBC 6.7 4.0 - 10.5 K/uL   RBC 5.27 4.22 - 5.81 MIL/uL   Hemoglobin 16.3 13.0 - 17.0 g/dL   HCT 62.1 30.8 - 65.7 %   MCV 94.5 78.0 - 100.0 fL   MCH 30.9 26.0 - 34.0 pg   MCHC 32.7 30.0 - 36.0 g/dL   RDW 84.6 96.2 - 95.2 %   Platelets 169 150 - 400 K/uL   Neutrophils Relative % 59 %   Neutro Abs 4.0 1.7 - 7.7 K/uL   Lymphocytes Relative 30 %   Lymphs Abs 2.0 0.7 - 4.0 K/uL   Monocytes Relative 7 %   Monocytes Absolute 0.5 0.1 - 1.0 K/uL   Eosinophils Relative 3 %    Eosinophils Absolute 0.2 0.0 - 0.7 K/uL   Basophils Relative 1 %   Basophils Absolute 0.0 0.0 - 0.1 K/uL      Assessment & Plan:   Encounter Diagnoses  Name Primary?  Marland Kitchen Dysphagia, unspecified type Yes  . Weight loss   . Heme positive stool   .  Cigarette nicotine dependence with nicotine-induced disorder   . Hyperlipidemia, unspecified hyperlipidemia type   . Midline low back pain without sciatica, unspecified chronicity   . Dental decay     -Reviewed labs with pt -Cholesterol is mildly elevated but will not encourage lowfat diet at this time in light of continuing weight loss -recommended pt drink ensure daily to help with weight and nutritional needs -counseled on Smoking cessation -recommended Ice pack and tylenol prn low back/sacral pain -pt to see GI as scheduled -pt to continue the omeprazole -pt encouraged to get his [facility] application submitted -pt is on dental list -pt to follow up 6 weeks.  RTO sooner prn

## 2018-03-06 NOTE — Congregational Nurse Program (Signed)
Congregational Nurse Program Note  Date of Encounter: 03/06/2018  Past Medical History: Past Medical History:  Diagnosis Date  . Aneurysm (HCC)   . Depression     Encounter Details: CNP Questionnaire - 03/06/18 1337      Questionnaire   Patient Status  Not Applicable    Race  White or Caucasian    Location Patient Served At  [facility]  Not Applicable    Uninsured  Uninsured (Subsequent visits/quarter)    Food  No food insecurities    Housing/Utilities  Yes, have permanent housing    Transportation  Yes, need transportation assistance;Provided transportation assistance (bus pass, taxi voucher, etc.)    Interpersonal Safety  Yes, feel physically and emotionally safe where you currently live    Medication  No medication insecurities    Medical Provider  Yes    Referrals  Other    ED Visit Averted  Not Applicable    Life-Saving Intervention Made  Not Applicable       Walk in today to Stephen Valentine requesting assistance with transportation to an upcoming appointment with the [facility].  [facility] arranged and written on patient's appointment sheet with pickup times.  Discussed with client option to refer him to our summer Social work Tax inspector when she begins to assist with phone application as well as possible applying for disability.  Client agreeable.  Client given peanut butter crackers and a bottle of water as he is traveling by bicycle for transportation.  Will continue to follow as needed.

## 2018-04-11 ENCOUNTER — Other Ambulatory Visit: Payer: Self-pay | Admitting: *Deleted

## 2018-04-11 ENCOUNTER — Ambulatory Visit (INDEPENDENT_AMBULATORY_CARE_PROVIDER_SITE_OTHER): Payer: Self-pay | Admitting: Gastroenterology

## 2018-04-11 ENCOUNTER — Encounter: Payer: Self-pay | Admitting: Gastroenterology

## 2018-04-11 ENCOUNTER — Encounter: Payer: Self-pay | Admitting: *Deleted

## 2018-04-11 VITALS — BP 118/72 | HR 61 | Temp 96.8°F | Ht 70.0 in | Wt 142.4 lb

## 2018-04-11 DIAGNOSIS — R195 Other fecal abnormalities: Secondary | ICD-10-CM

## 2018-04-11 DIAGNOSIS — R1319 Other dysphagia: Secondary | ICD-10-CM

## 2018-04-11 DIAGNOSIS — R1310 Dysphagia, unspecified: Secondary | ICD-10-CM

## 2018-04-11 DIAGNOSIS — R634 Abnormal weight loss: Secondary | ICD-10-CM

## 2018-04-11 NOTE — Progress Notes (Signed)
Patient was given clenpiq sample

## 2018-04-11 NOTE — Patient Instructions (Addendum)
1. Upper endoscopy in near future. See separate instructions.  2. We will work towards having your colonoscopy done after we address your swallowing issues.

## 2018-04-11 NOTE — Progress Notes (Addendum)
Primary Care Physician:  Jacquelin Hawking, PA-C  Primary Gastroenterologist:  Jonette Eva, MD  REVIEWED-NO ADDITIONAL RECOMMENDATIONS.  Chief Complaint  Patient presents with  . Dysphagia  . Weight Loss    HPI:  Stephen Valentine is a 57 y.o. male here at the request of Jacquelin Hawking, [facility] for weight loss, heme positive stool, dysphagia. Patient has been in the area for 3-4 years. Recently established care at the [facility]. He reports weighing 230 pounds several years ago. He has had really hard time with dates/details. Reports gradual weight loss but had finally stabilized around 150-155 pounds for a long time. This year he has dropped nearly 10 pounds in the past 3 months.   He complains of change in voice, speaks quietly, "slurring" of his voice. A lot of phlegm builds up and he has to cough it out. He has poor dentition. He complains of food sticking when eating. Feels it in the throat area. Notes when he eats, few minutes later he feels hungry again. He does have a lot of nausea but no vomiting. No heartburn on omeprazole. BM regular. No melena, brbpr. No abd pain.   Feels very run down. Diffuse joint pain. He has been told over 15 years ago that he had a aneurysm. Patient is not sure of location ie chest/abd/brain. He presented with chest pain at the time it was discovered while living in [place].   No prior egd or colonoscopy.  Recent rectal exam by PCP showed heme positive stool.   Current Outpatient Medications  Medication Sig Dispense Refill  . omeprazole (PRILOSEC) 40 MG capsule Take 1 capsule (40 mg total) by mouth daily. 90 capsule 1   No current facility-administered medications for this visit.     Allergies as of 04/11/2018 - Review Complete 04/11/2018  Allergen Reaction Noted  . Penicillins Other (See Comments) 11/08/2017    Past Medical History:  Diagnosis Date  . Aneurysm (HCC)   . Depression     Past Surgical History:  Procedure Laterality Date  .  EYE SURGERY Bilateral    cataract  . MOUTH SURGERY     dental surgery    Family History  Problem Relation Age of Onset  . Cancer Father        not sure what kind  . Cancer Maternal Grandfather        not sure what kind  . Gout Maternal Grandfather     Social History   Socioeconomic History  . Marital status: Single    Spouse name: Not on file  . Number of children: Not on file  . Years of education: Not on file  . Highest education level: Not on file  Occupational History  . Not on file  Social Needs  . Financial resource strain: Not on file  . Food insecurity:    Worry: Not on file    Inability: Not on file  . Transportation needs:    Medical: Not on file    Non-medical: Not on file  Tobacco Use  . Smoking status: Current Every Day Smoker    Packs/day: 1.00    Years: 37.00    Pack years: 37.00    Types: Cigarettes  . Smokeless tobacco: Never Used  Substance and Sexual Activity  . Alcohol use: Yes    Frequency: Never    Comment: occasionally  . Drug use: No  . Sexual activity: Not on file  Lifestyle  . Physical activity:    Days per week: Not  on file    Minutes per session: Not on file  . Stress: Not on file  Relationships  . Social connections:    Talks on phone: Not on file    Gets together: Not on file    Attends religious service: Not on file    Active member of club or organization: Not on file    Attends meetings of clubs or organizations: Not on file    Relationship status: Not on file  . Intimate partner violence:    Fear of current or ex partner: Not on file    Emotionally abused: Not on file    Physically abused: Not on file    Forced sexual activity: Not on file  Other Topics Concern  . Not on file  Social History Narrative  . Not on file      ROS:  General: Negative for anorexia,  fever, chills,  Weakness. +fatigue, weight loss Eyes: Negative for vision changes.  ENT: Negative for hoarseness,  nasal congestion.see hpi CV:  Negative for chest pain, angina, palpitations, dyspnea on exertion, peripheral edema.  Respiratory: Negative for dyspnea at rest, dyspnea on exertion, cough, sputum, wheezing.  GI: See history of present illness. GU:  Negative for dysuria, hematuria, urinary incontinence, urinary frequency, nocturnal urination.  MS: + joint pain, low back pain.  Derm: Negative for rash or itching.  Neuro: Negative for weakness, abnormal sensation, seizure, frequent headaches, memory loss, confusion.  Psych: Negative for anxiety, depression, suicidal ideation, hallucinations.  Endo: Negative for unusual weight change.  Heme: Negative for bruising or bleeding. Allergy: Negative for rash or hives.    Physical Examination:  BP 118/72   Pulse 61   Temp (!) 96.8 F (36 C) (Oral)   Ht 5\' 10"  (1.778 m)   Wt 142 lb 6.4 oz (64.6 kg)   BMI 20.43 kg/m    General: thin male in NAD Head: Normocephalic, atraumatic.   Eyes: Conjunctiva pink, no icterus. Mouth: Oropharyngeal mucosa moist and pink , no lesions erythema or exudate. Dentition in poor repair, no uppers, few decayed lowers.  Neck: Supple without thyromegaly, masses, or lymphadenopathy.  Lungs: Clear to auscultation bilaterally.  Heart: Regular rate and rhythm, no murmurs rubs or gallops.  Abdomen: Bowel sounds are normal, nontender, nondistended, no hepatosplenomegaly or masses, no abdominal bruits or    hernia , no rebound or guarding.   Rectal: not performed Extremities: No lower extremity edema. No clubbing or deformities.  Neuro: Alert and oriented x 4 , grossly normal neurologically.  Skin: Warm and dry, no rash or jaundice.   Psych: Alert and cooperative, normal mood and affect.  Labs: Lab Results  Component Value Date   WBC 6.7 02/06/2018   HGB 16.3 02/06/2018   HCT 49.8 02/06/2018   MCV 94.5 02/06/2018   PLT 169 02/06/2018   Lab Results  Component Value Date   CREATININE 1.24 02/06/2018   BUN 9 02/06/2018   NA 142 02/06/2018   K  4.5 02/06/2018   CL 102 02/06/2018   CO2 28 02/06/2018   Lab Results  Component Value Date   ALT 21 02/06/2018   AST 27 02/06/2018   ALKPHOS 62 02/06/2018   BILITOT 1.2 02/06/2018   Lab Results  Component Value Date   TSH 2.843 02/06/2018   Lab Results  Component Value Date   HGBA1C 5.4 02/06/2018     Imaging Studies: No results found.

## 2018-04-11 NOTE — Progress Notes (Signed)
cc'ed to pcp °

## 2018-04-11 NOTE — Assessment & Plan Note (Addendum)
57 y/o male smoker who presents with weight loss, dysphagia, heme + stool. He reports massive weight loss over the past several years, about 10 pounds more recently. Complains of liquid and solid food dysphagia. No ASA/NSAIDS. Would be concerned about complicated GERD, underlying malignancy. Heme + stool on DRE needs investigation as well as patient has never had a colonoscopy. Will plan on EGD/ED and colonoscopy in the near future. Given patient's transportation needs, we are trying to make arrangements for both procedures to be performed the same day.  I have discussed the risks, alternatives, benefits with regards to but not limited to the risk of reaction to medication, bleeding, infection, perforation and the patient is agreeable to proceed. Written consent to be obtained.  If EGD/TCS are unremarkable, he will need further evaluation of weight loss/dysphagia with CT.

## 2018-04-16 ENCOUNTER — Telehealth: Payer: Self-pay

## 2018-04-17 ENCOUNTER — Ambulatory Visit: Payer: Self-pay | Admitting: Physician Assistant

## 2018-04-17 ENCOUNTER — Other Ambulatory Visit: Payer: Self-pay

## 2018-04-17 ENCOUNTER — Ambulatory Visit (HOSPITAL_COMMUNITY)
Admission: RE | Admit: 2018-04-17 | Discharge: 2018-04-17 | Disposition: A | Discharge status: Home / Self Care | Payer: Self-pay | Source: Ambulatory Visit | Attending: Gastroenterology | Admitting: Gastroenterology

## 2018-04-17 ENCOUNTER — Encounter (HOSPITAL_COMMUNITY): Payer: Self-pay | Admitting: *Deleted

## 2018-04-17 ENCOUNTER — Encounter (HOSPITAL_COMMUNITY)
Admission: RE | Disposition: A | Discharge status: Home / Self Care | Payer: Self-pay | Source: Ambulatory Visit | Attending: Gastroenterology

## 2018-04-17 DIAGNOSIS — K648 Other hemorrhoids: Secondary | ICD-10-CM | POA: Insufficient documentation

## 2018-04-17 DIAGNOSIS — Z8349 Family history of other endocrine, nutritional and metabolic diseases: Secondary | ICD-10-CM | POA: Insufficient documentation

## 2018-04-17 DIAGNOSIS — Q438 Other specified congenital malformations of intestine: Secondary | ICD-10-CM | POA: Insufficient documentation

## 2018-04-17 DIAGNOSIS — I729 Aneurysm of unspecified site: Secondary | ICD-10-CM | POA: Insufficient documentation

## 2018-04-17 DIAGNOSIS — K2970 Gastritis, unspecified, without bleeding: Secondary | ICD-10-CM

## 2018-04-17 DIAGNOSIS — Z809 Family history of malignant neoplasm, unspecified: Secondary | ICD-10-CM | POA: Insufficient documentation

## 2018-04-17 DIAGNOSIS — K2950 Unspecified chronic gastritis without bleeding: Secondary | ICD-10-CM | POA: Insufficient documentation

## 2018-04-17 DIAGNOSIS — Z880 Allergy status to penicillin: Secondary | ICD-10-CM | POA: Insufficient documentation

## 2018-04-17 DIAGNOSIS — R634 Abnormal weight loss: Secondary | ICD-10-CM

## 2018-04-17 DIAGNOSIS — K921 Melena: Secondary | ICD-10-CM | POA: Insufficient documentation

## 2018-04-17 DIAGNOSIS — Z79899 Other long term (current) drug therapy: Secondary | ICD-10-CM | POA: Insufficient documentation

## 2018-04-17 DIAGNOSIS — R195 Other fecal abnormalities: Secondary | ICD-10-CM

## 2018-04-17 DIAGNOSIS — Z6820 Body mass index (BMI) 20.0-20.9, adult: Secondary | ICD-10-CM | POA: Insufficient documentation

## 2018-04-17 DIAGNOSIS — Q394 Esophageal web: Secondary | ICD-10-CM

## 2018-04-17 DIAGNOSIS — F329 Major depressive disorder, single episode, unspecified: Secondary | ICD-10-CM | POA: Insufficient documentation

## 2018-04-17 DIAGNOSIS — R1310 Dysphagia, unspecified: Secondary | ICD-10-CM

## 2018-04-17 DIAGNOSIS — K644 Residual hemorrhoidal skin tags: Secondary | ICD-10-CM | POA: Insufficient documentation

## 2018-04-17 DIAGNOSIS — R1319 Other dysphagia: Secondary | ICD-10-CM

## 2018-04-17 DIAGNOSIS — F17210 Nicotine dependence, cigarettes, uncomplicated: Secondary | ICD-10-CM | POA: Insufficient documentation

## 2018-04-17 HISTORY — PX: BIOPSY: SHX5522

## 2018-04-17 HISTORY — PX: ESOPHAGOGASTRODUODENOSCOPY: SHX5428

## 2018-04-17 HISTORY — PX: COLONOSCOPY: SHX5424

## 2018-04-17 HISTORY — PX: SAVORY DILATION: SHX5439

## 2018-04-17 SURGERY — COLONOSCOPY
Anesthesia: Moderate Sedation

## 2018-04-17 MED ORDER — LIDOCAINE VISCOUS HCL 2 % MT SOLN
OROMUCOSAL | Status: DC | PRN
  Administered 2018-04-17: 1 via OROMUCOSAL

## 2018-04-17 MED ORDER — MIDAZOLAM HCL 5 MG/5ML IJ SOLN
INTRAMUSCULAR | Status: DC | PRN
  Administered 2018-04-17: 2 mg via INTRAVENOUS
  Administered 2018-04-17 (×2): 1 mg via INTRAVENOUS

## 2018-04-17 MED ORDER — MIDAZOLAM HCL 5 MG/5ML IJ SOLN
INTRAMUSCULAR | Status: AC
  Filled 2018-04-17: qty 10

## 2018-04-17 MED ORDER — LIDOCAINE VISCOUS HCL 2 % MT SOLN
OROMUCOSAL | Status: AC
  Filled 2018-04-17: qty 15

## 2018-04-17 MED ORDER — MEPERIDINE HCL 100 MG/ML IJ SOLN
INTRAMUSCULAR | Status: DC | PRN
  Administered 2018-04-17 (×2): 25 mg via INTRAVENOUS

## 2018-04-17 MED ORDER — SODIUM CHLORIDE 0.9% FLUSH
INTRAVENOUS | Status: DC
Start: 2018-04-17 — End: 2018-04-17
  Filled 2018-04-17: qty 10

## 2018-04-17 MED ORDER — STERILE WATER FOR IRRIGATION IR SOLN
Status: DC | PRN
  Administered 2018-04-17: 2.5 mL

## 2018-04-17 MED ORDER — SODIUM CHLORIDE 0.9 % IV SOLN
INTRAVENOUS | Status: DC
  Administered 2018-04-17: 1000 mL via INTRAVENOUS

## 2018-04-17 MED ORDER — PROMETHAZINE HCL 25 MG/ML IJ SOLN
INTRAMUSCULAR | Status: AC
  Filled 2018-04-17: qty 1

## 2018-04-17 MED ORDER — MEPERIDINE HCL 100 MG/ML IJ SOLN
INTRAMUSCULAR | Status: AC
  Filled 2018-04-17: qty 2

## 2018-04-17 MED ORDER — MINERAL OIL PO OIL
TOPICAL_OIL | ORAL | Status: AC
  Filled 2018-04-17: qty 30

## 2018-04-17 MED ORDER — PROMETHAZINE HCL 25 MG/ML IJ SOLN
INTRAMUSCULAR | Status: DC | PRN
  Administered 2018-04-17: 12.5 mg via INTRAVENOUS

## 2018-04-17 NOTE — Telephone Encounter (Signed)
Client does not have a phone. Permission given to use his emergency contact Stephen Valentine.  Stephen Hashimotoatricia Valentine was not available, left message for her to return call .  Call was regarding arranging [facility] for Stephen Valentine's upcoming free clinic appointment on 04/23/18 [facility] has been arranged. Stephen Valentine will be 1 pm for 2 pm appointment.  Stephen GablePatricia Melissa Pulido RN  [facility] [phone]

## 2018-04-17 NOTE — Discharge Instructions (Signed)
You did not have any polyps removed. You have internal hemorrhoids,whic may cause blood to be detected in your stool. I dilated your esophagus DUE TO A STRICTURE NEAR THE BASE OF YOUR ESOPHAGUS. YOU HAVE A SMALL HIATAL HERNIA and mild gastritis. I BIOPSIED YOUR small bowel AND STOMACH.   DRINK WATER TO KEEP YOUR URINE LIGHT YELLOW.  FOLLOW A HIGH FIBER DIET. AVOID ITEMS THAT CAUSE BLOATING. SEE INFO BELOW.  CONTINUE OMEPRAZOLE.  TAKE 30 MINUTES PRIOR TO YOUR FIRST MEAL.  YOUR BIOPSY WILL BE BACK IN 7 DAYS. If NO SOURCE FOR YOUR WEIGHT LOSS CAN BE IDENTIFIED THEN YOU MAY NEED A  CT SCAN.  FOLLOW UP IN 4 MOS.   Next colonoscopy in 10 years.   ENDOSCOPY Care After Read the instructions outlined below and refer to this sheet in the next week. These discharge instructions provide you with general information on caring for yourself after you leave the hospital. While your treatment has been planned according to the most current medical practices available, unavoidable complications occasionally occur. If you have any problems or questions after discharge, call DR. Manveer Gomes, [phone].  ACTIVITY  You may resume your regular activity, but move at a slower pace for the next 24 hours.   Take frequent rest periods for the next 24 hours.   Walking will help get rid of the air and reduce the bloated feeling in your belly (abdomen).   No driving for 24 hours (because of the medicine (anesthesia) used during the test).   You may shower.   Do not sign any important legal documents or operate any machinery for 24 hours (because of the anesthesia used during the test).    NUTRITION  Drink plenty of fluids.   You may resume your normal diet as instructed by your doctor.   Begin with a light meal and progress to your normal diet. Heavy or fried foods are harder to digest and may make you feel sick to your stomach (nauseated).   Avoid alcoholic beverages for 24 hours or as instructed.     MEDICATIONS  You may resume your normal medications.   WHAT YOU CAN EXPECT TODAY  Some feelings of bloating in the abdomen.   Passage of more gas than usual.   Spotting of blood in your stool or on the toilet paper  .  IF YOU HAD POLYPS REMOVED DURING THE ENDOSCOPY:  Eat a soft diet IF YOU HAVE NAUSEA, BLOATING, ABDOMINAL PAIN, OR VOMITING.    FINDING OUT THE RESULTS OF YOUR TEST Not all test results are available during your visit. DR. Darrick Penna WILL CALL YOU WITHIN 7 DAYS OF YOUR PROCEDUE WITH YOUR RESULTS. Do not assume everything is normal if you have not heard from DR. Shenouda Genova IN ONE WEEK, CALL HER OFFICE AT [phone].  SEEK IMMEDIATE MEDICAL ATTENTION AND CALL THE OFFICE: [phone] IF:  You have more than a spotting of blood in your stool.   Your belly is swollen (abdominal distention).   You are nauseated or vomiting.   You have a temperature over 101F.   You have abdominal pain or discomfort that is severe or gets worse throughout the day.   High-Fiber Diet A high-fiber diet changes your normal diet to include more whole grains, legumes, fruits, and vegetables. Changes in the diet involve replacing refined carbohydrates with unrefined foods. The calorie level of the diet is essentially unchanged. The Dietary Reference Intake (recommended amount) for adult males is 38 grams per day. For adult females,  it is 25 grams per day. Pregnant and lactating women should consume 28 grams of fiber per day. Fiber is the intact part of a plant that is not broken down during digestion. Functional fiber is fiber that has been isolated from the plant to provide a beneficial effect in the body. PURPOSE  Increase stool bulk.   Ease and regulate bowel movements.   Lower cholesterol.  INDICATIONS THAT YOU NEED MORE FIBER  Constipation and hemorrhoids.   Uncomplicated diverticulosis (intestine condition) and irritable bowel syndrome.   Weight management.   As a  protective measure against hardening of the arteries (atherosclerosis), diabetes, and cancer.   GUIDELINES FOR INCREASING FIBER IN THE DIET  Start adding fiber to the diet slowly. A gradual increase of about 5 more grams (2 slices of whole-wheat bread, 2 servings of most fruits or vegetables, or 1 bowl of high-fiber cereal) per day is best. Too rapid an increase in fiber may result in constipation, flatulence, and bloating.   Drink enough water and fluids to keep your urine clear or pale yellow. Water, juice, or caffeine-free drinks are recommended. Not drinking enough fluid may cause constipation.   Eat a variety of high-fiber foods rather than one type of fiber.   Try to increase your intake of fiber through using high-fiber foods rather than fiber pills or supplements that contain small amounts of fiber.   The goal is to change the types of food eaten. Do not supplement your present diet with high-fiber foods, but replace foods in your present diet.  INCLUDE A VARIETY OF FIBER SOURCES  Replace refined and processed grains with whole grains, canned fruits with fresh fruits, and incorporate other fiber sources. White rice, white breads, and most bakery goods contain little or no fiber.   Brown whole-grain rice, buckwheat oats, and many fruits and vegetables are all good sources of fiber. These include: broccoli, Brussels sprouts, cabbage, cauliflower, beets, sweet potatoes, white potatoes (skin on), carrots, tomatoes, eggplant, squash, berries, fresh fruits, and dried fruits.   Cereals appear to be the richest source of fiber. Cereal fiber is found in whole grains and bran. Bran is the fiber-rich outer coat of cereal grain, which is largely removed in refining. In whole-grain cereals, the bran remains. In breakfast cereals, the largest amount of fiber is found in those with "bran" in their names. The fiber content is sometimes indicated on the label.   You may need to include additional fruits  and vegetables each day.   In baking, for 1 cup white flour, you may use the following substitutions:   1 cup whole-wheat flour minus 2 tablespoons.   1/2 cup white flour plus 1/2 cup whole-wheat flour.    Hiatal Hernia A hiatal hernia occurs when a part of the stomach slides above the diaphragm. The diaphragm is the thin muscle separating the belly (abdomen) from the chest. A hiatal hernia can be something you are born with or develop over time. Hiatal hernias may allow stomach acid to flow back into your esophagus, the tube which carries food from your mouth to your stomach. If this acid causes problems it is called GERD (gastro-esophageal reflux disease).   SYMPTOMS Common symptoms of GERD are heartburn (burning in your chest). This is worse when lying down or bending over. It may also cause belching and indigestion. Some of the things which make GERD worse are:  Increased weight pushes on stomach making acid rise more easily.   Smoking markedly increases acid production.  Alcohol decreases lower esophageal sphincter pressure (valve between stomach and esophagus), allowing acid from stomach into esophagus.   Late evening meals and going to bed with a full stomach increases pressure.   Anything that causes an increase in acid production.    HOME CARE INSTRUCTIONS  Try to achieve and maintain an ideal body weight.   Avoid drinking alcoholic beverages.   DO NOT smokE.   Do not wear tight clothing around your chest or stomach.   Eat smaller meals and eat more frequently. This keeps your stomach from getting too full. Eat slowly.   Do not lie down for 2 or 3 hours after eating. Do not eat or drink anything 1 to 2 hours before going to bed.   Avoid caffeine beverages (colas, coffee, cocoa, tea), fatty foods, citrus fruits and all other foods and drinks that contain acid and that seem to increase the problems.   Avoid bending over, especially after eating OR STRAINING. Anything  that increases the pressure in your belly increases the amount of acid that may be pushed up into your esophagus.    Hemorrhoids Hemorrhoids are dilated (enlarged) veins around the rectum. Sometimes clots will form in the veins. This makes them swollen and painful. These are called thrombosed hemorrhoids. Causes of hemorrhoids include:  Constipation.   Straining to have a bowel movement.   HEAVY LIFTING HOME CARE INSTRUCTIONS  Eat a well balanced diet and drink 6 to 8 glasses of water every day to avoid constipation. You may also use a bulk laxative.   Avoid straining to have bowel movements.   Keep anal area dry and clean.   Do not use a donut shaped pillow or sit on the toilet for long periods. This increases blood pooling and pain.   Move your bowels when your body has the urge; this will require less straining and will decrease pain and pressure.

## 2018-04-17 NOTE — Op Note (Signed)
[facility] Patient Name: Stephen Valentine Hutt Procedure Date: 04/17/2018 11:45 AM MRN: [id number] Date of Birth: 08/17/1961 Attending MD: Jonette EvaSandi Ariyan Sinnett MD, MD CSN: [id number] Age: [age] Admit Type: Outpatient Procedure:                Colonoscopy, diagnostic Indications:              Heme positive stool Providers:                Jonette EvaSandi Almarosa Bohac MD, MD, Nena PolioLisa Moore, RN, Edythe ClarityKelly Cox,                            Technician Referring MD:             Jacquelin HawkingShannon McElroy PA-C Medicines:                Promethazine 12.5 mg IV, Meperidine 50 mg IV,                            Midazolam 4 mg IV Complications:            No immediate complications. Estimated Blood Loss:     Estimated blood loss: none. Procedure:                Pre-Anesthesia Assessment:                           - Prior to the procedure, a History and Physical                            was performed, and patient medications and                            allergies were reviewed. The patient's tolerance of                            previous anesthesia was also reviewed. The risks                            and benefits of the procedure and the sedation                            options and risks were discussed with the patient.                            All questions were answered, and informed consent                            was obtained. Prior Anticoagulants: The patient has                            taken aspirin. ASA Grade Assessment: II - A patient                            with mild systemic disease. After reviewing the  risks and benefits, the patient was deemed in                            satisfactory condition to undergo the procedure.                            After obtaining informed consent, the colonoscope                            was passed under direct vision. Throughout the                            procedure, the patient's blood pressure, pulse, and                            oxygen  saturations were monitored continuously. The                            EC-[id number] ([id number]) scope was introduced through                            the anus and advanced to the 10 cm into the ileum.                            The colonoscopy was somewhat difficult due to a                            redundant colon. Successful completion of the                            procedure was aided by straightening and shortening                            the scope to obtain bowel loop reduction and                            COLOWRAP. The patient tolerated the procedure well.                            The quality of the bowel preparation was good. The                            terminal ileum, ileocecal valve, appendiceal                            orifice, and rectum were photographed. Scope In: 12:06:29 PM Scope Out: 12:21:27 PM Scope Withdrawal Time: 0 hours 10 minutes 33 seconds  Total Procedure Duration: 0 hours 14 minutes 58 seconds  Findings:      The terminal ileum appeared normal.      The recto-sigmoid colon and sigmoid colon were moderately redundant.      Internal hemorrhoids were found. The hemorrhoids were small.      External hemorrhoids were found. The hemorrhoids were moderate. Impression:               -  The examined portion of the ileum was normal.                           - Redundant left colon.                           - Heme positive stools due to Internal hemorrhoids.                           - External hemorrhoids. Moderate Sedation:      Moderate (conscious) sedation was administered by the endoscopy nurse       and supervised by the endoscopist. The following parameters were       monitored: oxygen saturation, heart rate, blood pressure, and response       to care. Total physician intraservice time was 30 minutes. Recommendation:           - Repeat colonoscopy in 10 years for surveillance.                           - Return to my office in 4 months.                            - High fiber diet.                           - Continue present medications.                           - Patient has a contact number available for                            emergencies. The signs and symptoms of potential                            delayed complications were discussed with the                            patient. Return to normal activities tomorrow.                            Written discharge instructions were provided to the                            patient. Procedure Code(s):        --- Professional ---                           [id number], Colonoscopy, flexible; diagnostic, including                            collection of specimen(s) by brushing or washing,                            when performed (separate procedure)  G0500, Moderate sedation services provided by the                            same physician or other qualified health care                            professional performing a gastrointestinal                            endoscopic service that sedation supports,                            requiring the presence of an independent trained                            observer to assist in the monitoring of the                            patient's level of consciousness and physiological                            status; initial 15 minutes of intra-service time;                            patient age 55 years or older (additional time may                            be reported with [phone], as appropriate)                           [phone], Moderate sedation services provided by the                            same physician or other qualified health care                            professional performing the diagnostic or                            therapeutic service that the sedation supports,                            requiring the presence of an independent trained                            observer to assist in the  monitoring of the                            patient's level of consciousness and physiological                            status; each additional 15 minutes intraservice  time (List separately in addition to code for                            primary service) Diagnosis Code(s):        --- Professional ---                           K64.4, Residual hemorrhoidal skin tags                           K64.8, Other hemorrhoids                           R19.5, Other fecal abnormalities                           Q43.8, Other specified congenital malformations of                            intestine CPT copyright 2017 [employer]. All rights reserved. The codes documented in this report are preliminary and upon coder review may  be revised to meet current compliance requirements. Jonette Eva, MD Jonette Eva MD, MD 04/17/2018 1:05:17 PM This report has been signed electronically. Number of Addenda: 0

## 2018-04-17 NOTE — H&P (Signed)
Primary Care Physician:  [name], Shannon, PA-C Primary Gastroenterologist:  Dr. Darrick PennaFields  Pre-Procedure History & Physical: HPI:  Stephen Valentine is a [age] y.o. male here for HEME POS STOOLS/dysphagia    Past Medical History:  Diagnosis Date  . Aneurysm [facility](HCC)    patient does not know location of aneurysm  . Depression     Past Surgical History:  Procedure Laterality Date  . EYE SURGERY Bilateral    cataract  . MOUTH SURGERY     dental surgery    Prior to Admission medications   Medication Sig Start Date End Date Taking? Authorizing Provider  omeprazole (PRILOSEC) 40 MG capsule Take 1 capsule (40 mg total) by mouth daily. 01/29/18  Yes [name], Shannon, PA-C    Allergies as of 04/11/2018 - Review Complete 04/11/2018  Allergen Reaction Noted  . Penicillins Other (See Comments) 11/08/2017    Family History  Problem Relation Age of Onset  . Cancer Father        not sure what kind  . Cancer Maternal Grandfather        not sure what kind  . Gout Maternal Grandfather   . Colon polyps Neg Hx     Social History   Socioeconomic History  . Marital status: Single    Spouse name: Not on file  . Number of children: Not on file  . Years of education: Not on file  . Highest education level: Not on file  Occupational History  . Not on file  Social Needs  . Financial resource strain: Not on file  . Food insecurity:    Worry: Not on file    Inability: Not on file  . Transportation needs:    Medical: Not on file    Non-medical: Not on file  Tobacco Use  . Smoking status: Current Every Day Smoker    Packs/day: 1.00    Years: 37.00    Pack years: 37.00    Types: Cigarettes  . Smokeless tobacco: Never Used  Substance and Sexual Activity  . Alcohol use: Yes    Frequency: Never    Comment: occasionally  . Drug use: No  . Sexual activity: Not on file  Lifestyle  . Physical activity:    Days per week: Not on file    Minutes per session: Not on file  . Stress: Not on  file  Relationships  . Social connections:    Talks on phone: Not on file    Gets together: Not on file    Attends religious service: Not on file    Active member of club or organization: Not on file    Attends meetings of clubs or organizations: Not on file    Relationship status: Not on file  . Intimate partner violence:    Fear of current or ex partner: Not on file    Emotionally abused: Not on file    Physically abused: Not on file    Forced sexual activity: Not on file  Other Topics Concern  . Not on file  Social History Narrative  . Not on file    Review of Systems: See HPI, otherwise negative ROS   Physical Exam: BP 126/82   Pulse (!) 52   Temp 97.7 F (36.5 C) (Axillary)   Resp 12   Ht 5\' 9"  (1.753 m)   Wt 140 lb (63.5 kg)   SpO2 100%   BMI 20.67 kg/m  General:   Alert,  pleasant and cooperative in NAD Head:  Normocephalic and atraumatic. Neck:  Supple; Lungs:  Clear throughout to auscultation.    Heart:  Regular rate and rhythm. Abdomen:  Soft, nontender and nondistended. Normal bowel sounds, without guarding, and without rebound.   Neurologic:  Alert and  oriented x4;  grossly normal neurologically.  Impression/Plan:     HEME POS STOOLS/dysphagia  PLAN:  1.TCS/EGD/dil today DISCUSSED PROCEDURE, BENEFITS, & RISKS: < 1% chance of medication reaction, bleeding, perforation, or rupture of spleen/liver.

## 2018-04-17 NOTE — Op Note (Signed)
[facility] Patient Name: Stephen Valentine Procedure Date: 04/17/2018 12:22 PM MRN: [id number] Date of Birth: February 23, 1961 Attending MD: Jonette Eva MD, MD CSN: [id number] Age: 57 Admit Type: Outpatient Procedure:                Upper GI endoscopy WITH COLD FORCEPS BIOPSY Indications:              Dysphagia, Weight loss Providers:                Jonette Eva MD, MD, Nena Polio, RN, Edythe Clarity,                            Technician Referring MD:             Jacquelin Hawking PA-C Medicines:                TCS + Midazolam 1 mg IV Complications:            No immediate complications. Estimated Blood Loss:     Estimated blood loss was minimal. Procedure:                Pre-Anesthesia Assessment:                           - Prior to the procedure, a History and Physical                            was performed, and patient medications and                            allergies were reviewed. The patient's tolerance of                            previous anesthesia was also reviewed. The risks                            and benefits of the procedure and the sedation                            options and risks were discussed with the patient.                            All questions were answered, and informed consent                            was obtained. Prior Anticoagulants: The patient has                            taken aspirin. ASA Grade Assessment: II - A patient                            with mild systemic disease. After reviewing the                            risks and benefits, the patient was deemed in  satisfactory condition to undergo the procedure.                            After obtaining informed consent, the endoscope was                            passed under direct vision. Throughout the                            procedure, the patient's blood pressure, pulse, and                            oxygen saturations were monitored continuously. The                             [id number]-[id number] ([id number] scope was introduced through the                            mouth, and advanced to the second part of duodenum.                            The upper GI endoscopy was accomplished without                            difficulty. The patient tolerated the procedure                            well. 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 A guidewire was placed and the       scope was withdrawn. Dilation was performed with a Savary dilator with       mild resistance at 14 mm, 15 mm, 16 mm and 17 mm.      Patchy mild inflammation characterized by congestion (edema) and       erythema was found in the gastric antrum. Biopsies were taken with a       cold forceps for Helicobacter pylori testing(3: ANTRUM, 2: BODY).      The examined duodenum was normal. Biopsies for histology were taken with       a cold forceps for evaluation of celiac disease. Biopsies for histology       were taken with a cold forceps for evaluation of celiac disease(2: BULB,       4: 2ND PORTION). 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 Dilated.                           - MILD Gastritis. Biopsied.                           - NO SOURCE FOR WEIGHT LOSS IDENTIFIED. Moderate Sedation:      Moderate (conscious) sedation was administered by the endoscopy nurse       and supervised by the endoscopist. The following parameters  were       monitored: oxygen saturation, heart rate, blood pressure, and response       to care. Total physician intraservice time was 30 minutes. Recommendation:           - Await pathology results. CONSIDER CT NECK.                           - Resume previous diet.                           - Continue present medications.                           - Return to my office in 4 months.                           - Patient has a  contact number available for                            emergencies. The signs and symptoms of potential                            delayed complications were discussed with the                            patient. Return to normal activities tomorrow.                            Written discharge instructions were provided to the                            patient. Procedure Code(s):        --- Professional ---                           [phone], Esophagogastroduodenoscopy, flexible,                            transoral; with insertion of guide wire followed by                            passage of dilator(s) through esophagus over guide                            wire                           43239, Esophagogastroduodenoscopy, flexible,                            transoral; with biopsy, single or multiple                           G0500, Moderate sedation services provided by the  same physician or other qualified health care                            professional performing a gastrointestinal                            endoscopic service that sedation supports,                            requiring the presence of an independent trained                            observer to assist in the monitoring of the                            patient's level of consciousness and physiological                            status; initial 15 minutes of intra-service time;                            patient age 87 years or older (additional time may                            be reported with [phone], as appropriate)                           [phone], Moderate sedation services provided by the                            same physician or other qualified health care                            professional performing the diagnostic or                            therapeutic service that the sedation supports,                            requiring the presence of an independent trained                             observer to assist in the monitoring of the                            patient's level of consciousness and physiological                            status; each additional 15 minutes intraservice                            time (List separately in addition to code for  primary service) Diagnosis Code(s):        --- Professional ---                           Q39.4, Esophageal web                           K29.70, Gastritis, unspecified, without bleeding                           R13.10, Dysphagia, unspecified                           R63.4, Abnormal weight loss CPT copyright 2017 [employer]. All rights reserved. The codes documented in this report are preliminary and upon coder review may  be revised to meet current compliance requirements. Jonette Eva, MD Jonette Eva MD, MD 04/17/2018 1:13:12 PM This report has been signed electronically. Number of Addenda: 0

## 2018-04-20 ENCOUNTER — Telehealth: Payer: Self-pay | Admitting: Gastroenterology

## 2018-04-20 DIAGNOSIS — Z720 Tobacco use: Secondary | ICD-10-CM

## 2018-04-20 DIAGNOSIS — R634 Abnormal weight loss: Secondary | ICD-10-CM

## 2018-04-20 NOTE — Telephone Encounter (Signed)
Please call pt. His stomach Bx shows mild gastritis.  HIS SMALL BOWEL BIOPSIES ARE NORMAL. NO SOURCE FOR WEIGHTLOSS WAS IDENTIFIED.    DRINK WATER TO KEEP YOUR URINE LIGHT YELLOW.  FOLLOW A HIGH FIBER DIET. AVOID ITEMS THAT CAUSE BLOATING.  CONTINUE OMEPRAZOLE.  TAKE 30 MINUTES PRIOR TO YOUR FIRST MEAL.  BECAUSE NO SOURCE FOR YOUR WEIGHT LOSS CAN BE IDENTIFIED THEN YOU MAY NEED A  CT ABD/PELVIS W/ IV AND ORAL CONTRAST, DX: WEIGHT LOSS, UNINTENTIONAL, TOBACCO ABUSE.  FOLLOW UP IN 4 MOS E30 WEIGHT LOSS/DYSPHAGIA.   Next colonoscopy in 10 years.

## 2018-04-22 NOTE — Telephone Encounter (Signed)
PATIENT SCHEDULED AND ON RECALL  °

## 2018-04-22 NOTE — Telephone Encounter (Signed)
Pt notified of results, please schedule follow up in 4 months E30 weight loss/Dyspagia. Letter can be mailed to pt per pts request.

## 2018-04-23 ENCOUNTER — Ambulatory Visit: Payer: Self-pay | Admitting: Physician Assistant

## 2018-04-24 NOTE — Telephone Encounter (Signed)
Dr. Darrick PennaFields wants CT within 7 days. Called pt and informed him. He doesn't have any transportation for CT today. Has PCP appt tomorrow. CT scheduled for tomorrow at 12:30pm, arrive at 12:15pm. Pick up contrast, NPO 4 hours before test.  Called pt back and informed him of appt. States "the bus" is taking him to PCP appt tomorrow. He will find out if they can take him for CT. If CT needs to be rescheduled he will call back.

## 2018-04-24 NOTE — Addendum Note (Signed)
Addended by: Sandria SenterBOOTH, Savayah Waltrip C on: 04/24/2018 01:01 PM   Modules accepted: Orders

## 2018-04-24 NOTE — Telephone Encounter (Signed)
Pt called office, transportation is unable to take him to CT appt tomorrow. Called Central Scheduling, canceled CT for tomorrow. Rescheduled for 05/01/18 at 1:30pm, arrive at 1:15pm. Pick up contrast before day of test. NPO 4 hours before test. Called pt back and informed him. Letter mailed.

## 2018-04-24 NOTE — Addendum Note (Signed)
Addended by: Corrie MckusickBOOTH, Jonathon Tan C on: 04/24/2018 02:29 PM   Modules accepted: Orders

## 2018-04-25 ENCOUNTER — Ambulatory Visit (HOSPITAL_COMMUNITY): Payer: Self-pay

## 2018-04-25 ENCOUNTER — Encounter (HOSPITAL_COMMUNITY): Payer: Self-pay | Admitting: Gastroenterology

## 2018-04-25 ENCOUNTER — Telehealth: Payer: Self-pay

## 2018-04-25 ENCOUNTER — Ambulatory Visit: Payer: Self-pay | Admitting: Physician Assistant

## 2018-04-25 ENCOUNTER — Ambulatory Visit (HOSPITAL_COMMUNITY)
Admission: RE | Admit: 2018-04-25 | Discharge: 2018-04-25 | Disposition: A | Discharge status: Home / Self Care | Payer: Self-pay | Source: Ambulatory Visit | Attending: Physician Assistant | Admitting: Physician Assistant

## 2018-04-25 VITALS — BP 101/64 | HR 67 | Temp 98.1°F | Ht 70.0 in | Wt 138.5 lb

## 2018-04-25 DIAGNOSIS — R634 Abnormal weight loss: Secondary | ICD-10-CM | POA: Insufficient documentation

## 2018-04-25 DIAGNOSIS — F17219 Nicotine dependence, cigarettes, with unspecified nicotine-induced disorders: Secondary | ICD-10-CM | POA: Insufficient documentation

## 2018-04-25 DIAGNOSIS — J449 Chronic obstructive pulmonary disease, unspecified: Secondary | ICD-10-CM | POA: Insufficient documentation

## 2018-04-25 NOTE — Progress Notes (Signed)
BP 101/64 (BP Location: Left Arm, Patient Position: Sitting, Cuff Size: Normal)   Pulse 67   Temp 98.1 F (36.7 C)   Ht 5\' 10"  (1.778 m)   Wt 138 lb 8 oz (62.8 kg)   SpO2 94%   BMI 19.87 kg/m    Subjective:    Patient ID: Stephen Valentine, male    DOB: 1961/06/03, 57 y.o.   MRN: [id number]  HPI: Stephen Valentine is a 57 y.o. male presenting on 04/25/2018 for Follow-up   HPI   Pt has still not turned in his [facility] care application  Pt had EGD/colonoscopy which were unrevealing - CT abd/pelvis was ordered by GI for further evaluation of weight loss  Pt states general aches and pains, says throat a little sore.  States no major pain any one place.    Pt has lost 4 pounds since OV 6 weeks ago.    He is still smoking.   Relevant past medical, surgical, family and social history reviewed and updated as indicated. Interim medical history since our last visit reviewed. Allergies and medications reviewed and updated.   Current Outpatient Medications:  .  omeprazole (PRILOSEC) 40 MG capsule, Take 1 capsule (40 mg total) by mouth daily., Disp: 90 capsule, Rfl: 1   Review of Systems  Constitutional: Positive for fatigue. Negative for appetite change, chills, diaphoresis, fever and unexpected weight change.  HENT: Positive for congestion and dental problem. Negative for drooling, ear pain, facial swelling, hearing loss, mouth sores, sneezing, sore throat, trouble swallowing and voice change.   Eyes: Negative for pain, discharge, redness, itching and visual disturbance.  Respiratory: Negative for cough, choking, shortness of breath and wheezing.   Cardiovascular: Positive for leg swelling. Negative for chest pain and palpitations.  Gastrointestinal: Negative for abdominal pain, blood in stool, constipation, diarrhea and vomiting.  Endocrine: Positive for cold intolerance, heat intolerance and polydipsia.  Genitourinary: Negative for decreased urine volume, dysuria and hematuria.   Musculoskeletal: Positive for arthralgias and back pain. Negative for gait problem.  Skin: Negative for rash.  Allergic/Immunologic: Negative for environmental allergies.  Neurological: Negative for seizures, syncope, light-headedness and headaches.  Hematological: Negative for adenopathy.  Psychiatric/Behavioral: Negative for agitation, dysphoric mood and suicidal ideas. The patient is not nervous/anxious.     Per HPI unless specifically indicated above     Objective:    BP 101/64 (BP Location: Left Arm, Patient Position: Sitting, Cuff Size: Normal)   Pulse 67   Temp 98.1 F (36.7 C)   Ht 5\' 10"  (1.778 m)   Wt 138 lb 8 oz (62.8 kg)   SpO2 94%   BMI 19.87 kg/m   Wt Readings from Last 3 Encounters:  04/25/18 138 lb 8 oz (62.8 kg)  04/17/18 140 lb (63.5 kg)  04/11/18 142 lb 6.4 oz (64.6 kg)    Physical Exam  Constitutional: He is oriented to person, place, and time.  HENT:  Head: Normocephalic and atraumatic.  Eyes: Pupils are equal, round, and reactive to light. EOM are normal.  Neck: Neck supple.  Pulmonary/Chest: Effort normal and breath sounds normal. No stridor. No respiratory distress. He has no wheezes.  Abdominal: Soft. Bowel sounds are normal. He exhibits no distension. There is no tenderness.  Musculoskeletal: He exhibits no edema.  Neurological: He is alert and oriented to person, place, and time.  Skin: Skin is warm and dry.  Psychiatric: He has a normal mood and affect. His behavior is normal.  Nursing note and vitals reviewed.  Assessment & Plan:   Encounter Diagnoses  Name Primary?  . Weight loss Yes  . Cigarette nicotine dependence with nicotine-induced disorder     -pt to get Check CXR today -pt to get CT as scheduled -encouraged pt to turn in his [facility] care application as soon as possible -Counseled smoking cessation -Encouraged nutritional supplements like Boost or Ensure -pt to Follow up 6 weeks.  RTO sooner prn

## 2018-04-25 NOTE — Telephone Encounter (Signed)
Called regarding need for RCATs for a CT [facility] at [facility] on June 26 , 2019. Message left for client to return call.

## 2018-05-01 ENCOUNTER — Ambulatory Visit (HOSPITAL_COMMUNITY)
Admission: RE | Admit: 2018-05-01 | Discharge: 2018-05-01 | Disposition: A | Discharge status: Home / Self Care | Payer: Self-pay | Source: Ambulatory Visit | Attending: Gastroenterology | Admitting: Gastroenterology

## 2018-05-01 DIAGNOSIS — Z720 Tobacco use: Secondary | ICD-10-CM | POA: Insufficient documentation

## 2018-05-01 DIAGNOSIS — K648 Other hemorrhoids: Secondary | ICD-10-CM | POA: Insufficient documentation

## 2018-05-01 DIAGNOSIS — R634 Abnormal weight loss: Secondary | ICD-10-CM | POA: Insufficient documentation

## 2018-05-01 DIAGNOSIS — K561 Intussusception: Secondary | ICD-10-CM | POA: Insufficient documentation

## 2018-05-01 MED ORDER — IOPAMIDOL (ISOVUE-300) INJECTION 61%
100.0000 mL | Freq: Once | INTRAVENOUS | Status: AC | PRN
  Administered 2018-05-01: 100 mL via INTRAVENOUS

## 2018-05-07 NOTE — Progress Notes (Signed)
Left message for a return call

## 2018-05-10 ENCOUNTER — Other Ambulatory Visit: Payer: Self-pay | Admitting: *Deleted

## 2018-05-10 DIAGNOSIS — R499 Unspecified voice and resonance disorder: Secondary | ICD-10-CM

## 2018-05-10 NOTE — Progress Notes (Signed)
Pt is aware of the results of CT and knows to go to ED if severe abdominal pain associated with nausea and vomiting.   He would like a referral to ENT.

## 2018-05-23 ENCOUNTER — Telehealth: Payer: Self-pay

## 2018-05-23 NOTE — Telephone Encounter (Signed)
Called Dr. Avel Sensoreoh's office to f/u on referral. They left message for pt to call them 05/14/18, but he hasn't called them back. I mailed letter to pt.

## 2018-06-06 ENCOUNTER — Ambulatory Visit: Payer: Self-pay | Admitting: Physician Assistant

## 2018-06-10 ENCOUNTER — Encounter: Payer: Self-pay | Admitting: Physician Assistant

## 2018-06-25 ENCOUNTER — Ambulatory Visit: Payer: Self-pay | Admitting: Physician Assistant

## 2018-06-25 ENCOUNTER — Encounter: Payer: Self-pay | Admitting: Physician Assistant

## 2018-06-25 VITALS — BP 100/60 | HR 64 | Temp 98.1°F | Ht 70.0 in | Wt 134.0 lb

## 2018-06-25 DIAGNOSIS — R634 Abnormal weight loss: Secondary | ICD-10-CM

## 2018-06-25 DIAGNOSIS — F17219 Nicotine dependence, cigarettes, with unspecified nicotine-induced disorders: Secondary | ICD-10-CM

## 2018-06-25 DIAGNOSIS — R630 Anorexia: Secondary | ICD-10-CM

## 2018-06-25 NOTE — Progress Notes (Signed)
BP 100/60 (BP Location: Left Arm, Patient Position: Sitting, Cuff Size: Normal)   Pulse 64   Temp 98.1 F (36.7 C) (Other (Comment))   Ht 5\' 10"  (1.778 m)   Wt 134 lb (60.8 kg)   SpO2 97%   BMI 19.23 kg/m    Subjective:    Patient ID: Stephen Valentine, male    DOB: 11/06/1961, 57 y.o.   MRN: [id number]  HPI: Stephen Valentine is a 57 y.o. male presenting on 06/25/2018 for Follow-up   HPI   Pt was no-show to appointment earlier this month  Pt is still losing weight.  He says he is not really eating- he eats a snack once/daily and that is all.  He says he just doesn't have any appetitie.   He denies depression but just says he doesn't have much of a life any longer.    Pt had EGD and colonscopy in June.   He is still smoking some.    Relevant past medical, surgical, family and social history reviewed and updated as indicated. Interim medical history since our last visit reviewed. Allergies and medications reviewed and updated.   Current Outpatient Medications:  .  omeprazole (PRILOSEC) 40 MG capsule, Take 1 capsule (40 mg total) by mouth daily., Disp: 90 capsule, Rfl: 1   Review of Systems  Constitutional: Negative for appetite change, chills, diaphoresis, fatigue, fever and unexpected weight change.  HENT: Negative for congestion, dental problem, drooling, ear pain, facial swelling, hearing loss, mouth sores, sneezing, sore throat, trouble swallowing and voice change.   Eyes: Negative for pain, discharge, redness, itching and visual disturbance.  Respiratory: Negative for cough, choking, shortness of breath and wheezing.   Cardiovascular: Negative for chest pain, palpitations and leg swelling.  Gastrointestinal: Negative for abdominal pain, blood in stool, constipation, diarrhea and vomiting.  Endocrine: Negative for cold intolerance, heat intolerance and polydipsia.  Genitourinary: Negative for decreased urine volume, dysuria and hematuria.  Musculoskeletal: Negative  for arthralgias, back pain and gait problem.  Skin: Negative for rash.  Allergic/Immunologic: Negative for environmental allergies.  Neurological: Negative for seizures, syncope, light-headedness and headaches.  Hematological: Negative for adenopathy.  Psychiatric/Behavioral: Negative for agitation, dysphoric mood and suicidal ideas. The patient is not nervous/anxious.     Per HPI unless specifically indicated above     Objective:    BP 100/60 (BP Location: Left Arm, Patient Position: Sitting, Cuff Size: Normal)   Pulse 64   Temp 98.1 F (36.7 C) (Other (Comment))   Ht 5\' 10"  (1.778 m)   Wt 134 lb (60.8 kg)   SpO2 97%   BMI 19.23 kg/m   Wt Readings from Last 3 Encounters:  06/25/18 134 lb (60.8 kg)  04/25/18 138 lb 8 oz (62.8 kg)  04/17/18 140 lb (63.5 kg)    Physical Exam  Constitutional: He is oriented to person, place, and time. Vital signs are normal. He is cooperative. He has a sickly appearance. No distress.  HENT:  Head: Normocephalic and atraumatic.  Mouth/Throat: Uvula is midline. Abnormal dentition. Dental caries present.  Neck: Neck supple.  Cardiovascular: Normal rate and regular rhythm.  Pulmonary/Chest: Effort normal and breath sounds normal. He has no wheezes.  Abdominal: Soft. Bowel sounds are normal. There is no hepatosplenomegaly. There is no tenderness.  Musculoskeletal: He exhibits no edema.  Lymphadenopathy:    He has no cervical adenopathy.  Neurological: He is alert and oriented to person, place, and time.  Skin: Skin is warm and dry.  Psychiatric:  He has a normal mood and affect. His behavior is normal.  Vitals reviewed.       Assessment & Plan:    Encounter Diagnoses  Name Primary?  . Weight loss Yes  . Poor appetite   . Cigarette nicotine dependence with nicotine-induced disorder     -discussed with pt that he needs to eat in order to stop losing weight.  Discussed that he needs at least 3 meals (not just snacks) every day and a  fourth meal would be beneficial.  Recommended he supplement with nutritional shake.  -encouraged smoking cessation -pt denies depression and says he doesn't need counseling -pt to follow up in 2 months.  RTO sooner prn

## 2018-07-22 ENCOUNTER — Ambulatory Visit (INDEPENDENT_AMBULATORY_CARE_PROVIDER_SITE_OTHER): Payer: Self-pay | Admitting: Otolaryngology

## 2018-07-22 DIAGNOSIS — R1312 Dysphagia, oropharyngeal phase: Secondary | ICD-10-CM

## 2018-07-22 DIAGNOSIS — J343 Hypertrophy of nasal turbinates: Secondary | ICD-10-CM

## 2018-07-23 ENCOUNTER — Other Ambulatory Visit (INDEPENDENT_AMBULATORY_CARE_PROVIDER_SITE_OTHER): Payer: Self-pay | Admitting: Otolaryngology

## 2018-07-23 DIAGNOSIS — R1312 Dysphagia, oropharyngeal phase: Secondary | ICD-10-CM

## 2018-07-25 ENCOUNTER — Ambulatory Visit (HOSPITAL_COMMUNITY)
Admission: RE | Admit: 2018-07-25 | Discharge: 2018-07-25 | Disposition: A | Discharge status: Home / Self Care | Payer: Self-pay | Source: Ambulatory Visit | Attending: Otolaryngology | Admitting: Otolaryngology

## 2018-07-25 DIAGNOSIS — R1312 Dysphagia, oropharyngeal phase: Secondary | ICD-10-CM

## 2018-07-25 DIAGNOSIS — K224 Dyskinesia of esophagus: Secondary | ICD-10-CM | POA: Insufficient documentation

## 2018-07-25 DIAGNOSIS — R1310 Dysphagia, unspecified: Secondary | ICD-10-CM | POA: Insufficient documentation

## 2018-08-20 ENCOUNTER — Telehealth: Payer: Self-pay

## 2018-08-20 ENCOUNTER — Ambulatory Visit (INDEPENDENT_AMBULATORY_CARE_PROVIDER_SITE_OTHER): Payer: Self-pay | Admitting: Gastroenterology

## 2018-08-20 ENCOUNTER — Encounter: Payer: Self-pay | Admitting: Gastroenterology

## 2018-08-20 VITALS — BP 111/69 | HR 63 | Temp 96.8°F | Ht 70.0 in | Wt 135.6 lb

## 2018-08-20 DIAGNOSIS — R634 Abnormal weight loss: Secondary | ICD-10-CM

## 2018-08-20 DIAGNOSIS — K219 Gastro-esophageal reflux disease without esophagitis: Secondary | ICD-10-CM

## 2018-08-20 DIAGNOSIS — M2550 Pain in unspecified joint: Secondary | ICD-10-CM | POA: Insufficient documentation

## 2018-08-20 DIAGNOSIS — R1310 Dysphagia, unspecified: Secondary | ICD-10-CM

## 2018-08-20 MED ORDER — OMEPRAZOLE 40 MG PO CPDR: 40.0000 mg | DELAYED_RELEASE_CAPSULE | Freq: Two times a day (BID) | ORAL | 1 refills | Status: DC

## 2018-08-20 NOTE — Patient Instructions (Signed)
Please increase omeprazole to twice daily before breakfast and evening meal. We will fax your prescription to mail order pharmacy.  We will call with further instructions on upcoming labs to evaluate joint pain.

## 2018-08-20 NOTE — Progress Notes (Signed)
Primary Care Physician: Jacquelin Hawking, PA-C  Primary Gastroenterologist:  Jonette Eva, MD   Chief Complaint  Patient presents with  . Dysphagia    has return appt with ENT 09/02/18    HPI: Stephen Valentine is a 57 y.o. male here for follow-up dysphagia, weight loss, heme positive stool.  He had a EGD and colonoscopy in June 2019.  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 pylori, small bowel biopsies negative for celiac.  On colonoscopy he had small internal hemorrhoids/external hemorrhoids.  He saw Dr. Suszanne Conners for change in voice, cough.  Possible fiberoptic laryngoscopy was unremarkable. Esophagram July 25, 2018 ordered by Dr. Suszanne Conners showed prominent cricopharyngeal impression for age but no obstruction or diverticulum, mild nonspecific dysmotility with frequent proximal escape, negative for esophageal stricture or ring.  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  Patient states he feels hungry all the time.  He eats constantly.  Typically microwavable meals is what he has available.  Dysphasia somewhat better with esophageal dilation but at times feels like gas builds up and pushes back of his esophagus.  Makes it more difficult to swallow during those episodes.  He has some breakthrough heartburn on current PPI regimen.  Bowel movements are normal.  No blood in stool or melena.  His biggest complaint is diffuse joint pain.  Most notable in both shoulders.  Feels like his arms are heavy.  Complains of fatigue.  Continues to smoke.  He states for the majority of his life he is always weighed between 135 and 140 pounds.  At his heaviest he weighed up to 230 pounds but thus been more than 6 to 7 years ago.  His weight has  been stable over the past 2 months.  March 2019: 150 pounds April 2019: 142 pounds June 2019: 138.5 pounds August 2019: 134 pounds August 20, 2018: 135 pounds  Current Outpatient Medications  Medication Sig Dispense Refill  . omeprazole (PRILOSEC) 40 MG capsule Take 1 capsule (40 mg total) by mouth daily. 90 capsule 1   No current facility-administered medications for this visit.     Allergies as of 08/20/2018 - Review Complete 08/20/2018  Allergen Reaction Noted  . Penicillins Other (See Comments) 11/08/2017    ROS:  General: Negative for anorexia, recent weight loss, fever, chills, fatigue, weakness. ENT: Negative for hoarseness, nasal congestion.  Voice change, dysphagia  CV: Negative for chest pain, angina, palpitations, dyspnea on exertion, peripheral edema.  Respiratory: Negative for dyspnea at rest, dyspnea on exertion, cough, sputum, wheezing.  GI: See history of present illness. GU:  Negative for dysuria, hematuria, urinary incontinence, urinary frequency, nocturnal urination.  Endo: Negative for unusual weight change.    Physical Examination:   BP 111/69   Pulse 63   Temp (!) 96.8 F (36 C) (Oral)   Ht 5\' 10"  (1.778 m)   Wt 135 lb 9.6 oz (61.5 kg)   BMI 19.46 kg/m   General: Thin appearing white male, no acute distress.  Smells heavily of cigarette smoke.  Somewhat disheveled appearing. Eyes: No icterus. Mouth: Oropharyngeal mucosa moist and pink , no lesions erythema or exudate.  Port intention. Lungs: Clear to auscultation bilaterally.  Heart: Regular rate and rhythm, no murmurs rubs or gallops.  Abdomen: Bowel sounds are normal, nontender, nondistended, no hepatosplenomegaly or masses, no abdominal bruits or hernia , no rebound or guarding.   Extremities: No lower extremity edema. No clubbing or deformities. Neuro: Alert and oriented x 4   Skin: Warm and dry, no jaundice.   Psych: Alert and cooperative, normal mood and affect.  Labs:  Lab Results    Component Value Date   CREATININE 1.24 02/06/2018   BUN 9 02/06/2018   NA 142 02/06/2018   K 4.5 02/06/2018   CL 102 02/06/2018   CO2 28 02/06/2018   Lab Results  Component Value Date   WBC 6.7 02/06/2018   HGB 16.3 02/06/2018   HCT 49.8 02/06/2018   MCV 94.5 02/06/2018   PLT 169 02/06/2018   Lab Results  Component Value Date   TSH 2.843 02/06/2018   Lab Results  Component Value Date   HGBA1C 5.4 02/06/2018    Lab Results  Component Value Date   ALT 21 02/06/2018   AST 27 02/06/2018   ALKPHOS 62 02/06/2018   BILITOT 1.2 02/06/2018    Imaging Studies: Dg Esophagus  Result Date: 07/25/2018 CLINICAL DATA:  Dysphagia with solids and liquids on and off since January. EXAM: ESOPHOGRAM / BARIUM SWALLOW / BARIUM TABLET STUDY TECHNIQUE: Combined double contrast and single contrast examination performed using effervescent crystals, thick barium liquid, and thin barium liquid. The patient was observed with fluoroscopy swallowing a 13 mm barium sulphate tablet. FLUOROSCOPY TIME:  Fluoroscopy Time:  1.5 minute Radiation Exposure Index (if provided by the fluoroscopic device): 5.3 mGy Number of Acquired Spot Images: 0 COMPARISON:  None. FINDINGS: Lateral pharyngeal imaging shows good airway protection and oral coordination. There is a prominent cricopharyngeal bar but no diverticulum or obstruction. Smooth mucosal contour of the esophagus. No hiatal hernia, stricture, or ring is seen. Nonspecific mild dysmotility with frequent proximal escape. A 13 mm barium tablet successfully traversed the esophagus. IMPRESSION: 1. Negative for esophageal stricture or ring. 2. Prominent cricopharyngeal impression for age but no obstruction or diverticulum. 3. Mild, nonspecific dysmotility with frequent proximal escape. Electronically Signed   By: Marnee Spring M.D.   On: 07/25/2018 10:06

## 2018-08-20 NOTE — Telephone Encounter (Signed)
LMOM for a return call. I need info on mail order pharmacy to fax Rx to. Also, he needs to go to [facility] for labs.

## 2018-08-20 NOTE — Progress Notes (Signed)
Please have patient go to [facility] lab to have labs done. Orders entered.

## 2018-08-20 NOTE — Assessment & Plan Note (Addendum)
Weight has stabilized over the past 2 months.  Patient states he gets hungry all the time, trying to eat frequently.  Really not able to "cook food".  He states he "does not have the set up".  Eating microwavable meals with protein.  Dysphagia overall somewhat improved.  Barium esophagram with some nonspecific dysmotility.  Feels like gas buildup and some breakthrough heartburn.  Biggest complaint of diffuse joint pain specifically upper extremities.  Feels like his arms are extremely heavy.  Really denies any exertional component.  Request he discuss with PCP at upcoming appointment next week.  He may need cardiology evaluation but in the interim we will obtain further labs to evaluate diffuse joint pain.

## 2018-08-20 NOTE — Telephone Encounter (Signed)
I have entered the [facility] info and faxed the prescription and pt is aware.

## 2018-08-20 NOTE — Telephone Encounter (Signed)
PT called and is aware to go to the hospital for the labs. He will call back with his mail order pharmacy info.

## 2018-08-20 NOTE — Progress Notes (Signed)
Lab orders have been faxed to [facility] and pt is aware.

## 2018-08-20 NOTE — Progress Notes (Signed)
cc'ed to pcp °

## 2018-08-26 ENCOUNTER — Ambulatory Visit: Payer: Self-pay | Admitting: Physician Assistant

## 2018-08-26 ENCOUNTER — Other Ambulatory Visit (HOSPITAL_COMMUNITY)
Admission: RE | Admit: 2018-08-26 | Discharge: 2018-08-26 | Disposition: A | Discharge status: Home / Self Care | Payer: Self-pay | Source: Ambulatory Visit | Attending: Gastroenterology | Admitting: Gastroenterology

## 2018-08-26 ENCOUNTER — Encounter: Payer: Self-pay | Admitting: Physician Assistant

## 2018-08-26 VITALS — BP 105/66 | HR 81 | Temp 98.4°F | Ht 70.0 in | Wt 138.5 lb

## 2018-08-26 DIAGNOSIS — R634 Abnormal weight loss: Secondary | ICD-10-CM | POA: Insufficient documentation

## 2018-08-26 DIAGNOSIS — F17200 Nicotine dependence, unspecified, uncomplicated: Secondary | ICD-10-CM

## 2018-08-26 DIAGNOSIS — Z720 Tobacco use: Secondary | ICD-10-CM

## 2018-08-26 LAB — COMPREHENSIVE METABOLIC PANEL
ALBUMIN: 4.1 g/dL (ref 3.5–5.0)
ALT: 18 U/L (ref 0–44)
AST: 24 U/L (ref 15–41)
Alkaline Phosphatase: 54 U/L (ref 38–126)
Anion gap: 7 (ref 5–15)
BUN: 7 mg/dL (ref 6–20)
CO2: 29 mmol/L (ref 22–32)
CREATININE: 1 mg/dL (ref 0.61–1.24)
Calcium: 9 mg/dL (ref 8.9–10.3)
Chloride: 105 mmol/L (ref 98–111)
GFR calc Af Amer: >60 mL/min (ref >60)
GLUCOSE: 97 mg/dL (ref 70–99)
Potassium: 3.6 mmol/L (ref 3.5–5.1)
Sodium: 141 mmol/L (ref 135–145)
Total Bilirubin: 0.8 mg/dL (ref 0.3–1.2)
Total Protein: 7.1 g/dL (ref 6.5–8.1)

## 2018-08-26 LAB — C-REACTIVE PROTEIN

## 2018-08-26 LAB — SEDIMENTATION RATE: Sed Rate: 2 mm/hr (ref 0–16)

## 2018-08-26 NOTE — Progress Notes (Signed)
BP 105/66 (BP Location: Left Arm, Patient Position: Sitting, Cuff Size: Normal)   Pulse 81   Temp 98.4 F (36.9 C)   Ht 5\' 10"  (1.778 m)   Wt 138 lb 8 oz (62.8 kg)   SpO2 96%   BMI 19.87 kg/m    Subjective:    Patient ID: Stephen Valentine, male    DOB: June 07, 1961, 57 y.o.   MRN: [id number]  HPI: Stephen Valentine is a 57 y.o. male presenting on 08/26/2018 for Weight Loss   HPI   Pt walked in 30 minutes late for his appointment.  He is worked in anyway.  He says he is trying to eat more.  He has gained 4 pounds since he was here.  Speaking with him about what he eats, it appears that he is still eating very little.   He is still smoking.   He has follow up with dr Suszanne Conners (ENT) next Monday.    He had labs drawn for GI earlier today  Relevant past medical, surgical, family and social history reviewed and updated as indicated. Interim medical history since our last visit reviewed. Allergies and medications reviewed and updated.  Review of Systems  Constitutional: Positive for appetite change, diaphoresis, fatigue and unexpected weight change. Negative for chills and fever.  HENT: Positive for congestion, dental problem, trouble swallowing and voice change. Negative for drooling, ear pain, facial swelling, hearing loss, mouth sores, sneezing and sore throat.   Eyes: Negative for pain, discharge, redness, itching and visual disturbance.  Respiratory: Positive for choking. Negative for cough, shortness of breath and wheezing.   Cardiovascular: Negative for chest pain, palpitations and leg swelling.  Gastrointestinal: Negative for abdominal pain, blood in stool, constipation, diarrhea and vomiting.  Endocrine: Positive for polydipsia. Negative for cold intolerance and heat intolerance.  Genitourinary: Negative for decreased urine volume, dysuria and hematuria.  Musculoskeletal: Positive for back pain. Negative for arthralgias and gait problem.  Skin: Negative for rash.   Allergic/Immunologic: Negative for environmental allergies.  Neurological: Negative for seizures, syncope, light-headedness and headaches.  Hematological: Negative for adenopathy.  Psychiatric/Behavioral: Negative for agitation, dysphoric mood and suicidal ideas. The patient is not nervous/anxious.     Per HPI unless specifically indicated above     Objective:    BP 105/66 (BP Location: Left Arm, Patient Position: Sitting, Cuff Size: Normal)   Pulse 81   Temp 98.4 F (36.9 C)   Ht 5\' 10"  (1.778 m)   Wt 138 lb 8 oz (62.8 kg)   SpO2 96%   BMI 19.87 kg/m   Wt Readings from Last 3 Encounters:  08/26/18 138 lb 8 oz (62.8 kg)  08/20/18 135 lb 9.6 oz (61.5 kg)  06/25/18 134 lb (60.8 kg)    Physical Exam  Constitutional: He is oriented to person, place, and time. He appears well-developed and well-nourished.  HENT:  Head: Normocephalic and atraumatic.  Neck: Neck supple.  Cardiovascular: Normal rate and regular rhythm.  Pulmonary/Chest: Effort normal and breath sounds normal. He has no wheezes.  Abdominal: Soft. Bowel sounds are normal. There is no hepatosplenomegaly. There is no tenderness.  Musculoskeletal: He exhibits no edema.  Lymphadenopathy:    He has no cervical adenopathy.  Neurological: He is alert and oriented to person, place, and time.  Skin: Skin is warm and dry.  Psychiatric: He has a normal mood and affect. His behavior is normal.  Vitals reviewed.   Results for orders placed or performed during the hospital encounter of 08/26/18  Sedimentation rate  Result Value Ref Range   Sed Rate 2 0 - 16 mm/hr  Comprehensive metabolic panel  Result Value Ref Range   Sodium 141 135 - 145 mmol/L   Potassium 3.6 3.5 - 5.1 mmol/L   Chloride 105 98 - 111 mmol/L   CO2 29 22 - 32 mmol/L   Glucose, Bld 97 70 - 99 mg/dL   BUN 7 6 - 20 mg/dL   Creatinine, Ser 1.61 0.61 - 1.24 mg/dL   Calcium 9.0 8.9 - 09.6 mg/dL   Total Protein 7.1 6.5 - 8.1 g/dL   Albumin 4.1 3.5 - 5.0  g/dL   AST 24 15 - 41 U/L   ALT 18 0 - 44 U/L   Alkaline Phosphatase 54 38 - 126 U/L   Total Bilirubin 0.8 0.3 - 1.2 mg/dL   GFR calc non Af Amer >60 >60 mL/min   GFR calc Af Amer >60 >60 mL/min   Anion gap 7 5 - 15      Assessment & Plan:   Encounter Diagnoses  Name Primary?  . Weight loss Yes  . Tobacco use disorder   . Tobacco use     -will get Ct chest in light of weight loss and long history of smoking -pt is counseled on smoking cessation -counseled pt on increasing his intake (food) -pt to continue with Dr Suszanne Conners for dysphagia -pt to follow up 3 months.  RTO sooner prn

## 2018-08-27 LAB — HIV ANTIBODY (ROUTINE TESTING W REFLEX): HIV Screen 4th Generation wRfx: NONREACTIVE

## 2018-08-27 LAB — RHEUMATOID FACTOR

## 2018-08-27 LAB — HEPATITIS C ANTIBODY

## 2018-08-27 LAB — ANA: ANA: NEGATIVE

## 2018-08-27 LAB — HEPATITIS B SURFACE ANTIGEN: HEP B S AG: NEGATIVE

## 2018-08-28 ENCOUNTER — Telehealth: Payer: Self-pay

## 2018-08-28 NOTE — Telephone Encounter (Signed)
Client's ex-wife called regarding Stephen Valentine needing some [facility] arranged for upcoming medical appointments. NOTE: client does not have a phone. Appointments are as follows Oct 28th to Dr. Suszanne Conners in [place] at 2pm  November 12th to [facility] 1[facility]   Will call [facility] and then call back Stephen Valentine to confirm pickup times.   [name] RN  [facility]

## 2018-08-28 NOTE — Telephone Encounter (Signed)
Called Mrs Trollinger client's ex-wife back to confirm [facility] arranged for following medical appointments.  09/02/18 to Dr. Suszanne Conners. Pickup time approx 1PM.  09/17/18 to [facility] pickup time approx 2PM  Unable to schedule 12/25/18 appointment at [facility] due to "too far ahead" per [facility]. Will need to call back and arrange in December. Informed Mrs. trollinger to call RN back around December 1st to arrange.  Will follow as needed.  NOTE: client does not have his own phone.

## 2018-09-02 ENCOUNTER — Ambulatory Visit (INDEPENDENT_AMBULATORY_CARE_PROVIDER_SITE_OTHER): Payer: Self-pay | Admitting: Otolaryngology

## 2018-09-02 DIAGNOSIS — R1312 Dysphagia, oropharyngeal phase: Secondary | ICD-10-CM

## 2018-09-02 DIAGNOSIS — R490 Dysphonia: Secondary | ICD-10-CM

## 2018-09-02 DIAGNOSIS — K219 Gastro-esophageal reflux disease without esophagitis: Secondary | ICD-10-CM

## 2018-09-16 ENCOUNTER — Encounter: Payer: Self-pay | Admitting: Gastroenterology

## 2018-09-16 NOTE — Progress Notes (Signed)
LMOM for a return call.  

## 2018-09-16 NOTE — Progress Notes (Signed)
Pt is aware. He is scheduled for CT tomorrow. Forwarding to [place] to schedule OV with Dr. Darrick Penna.

## 2018-09-17 ENCOUNTER — Ambulatory Visit (HOSPITAL_COMMUNITY)
Admission: RE | Admit: 2018-09-17 | Discharge: 2018-09-17 | Disposition: A | Discharge status: Home / Self Care | Payer: Self-pay | Source: Ambulatory Visit | Attending: Physician Assistant | Admitting: Physician Assistant

## 2018-09-17 DIAGNOSIS — R634 Abnormal weight loss: Secondary | ICD-10-CM | POA: Insufficient documentation

## 2018-09-17 DIAGNOSIS — F17200 Nicotine dependence, unspecified, uncomplicated: Secondary | ICD-10-CM

## 2018-09-17 DIAGNOSIS — Z720 Tobacco use: Secondary | ICD-10-CM | POA: Insufficient documentation

## 2018-09-17 DIAGNOSIS — J439 Emphysema, unspecified: Secondary | ICD-10-CM | POA: Insufficient documentation

## 2018-09-17 DIAGNOSIS — D71 Functional disorders of polymorphonuclear neutrophils: Secondary | ICD-10-CM | POA: Insufficient documentation

## 2018-09-17 DIAGNOSIS — I700 Atherosclerosis of aorta: Secondary | ICD-10-CM | POA: Insufficient documentation

## 2018-09-17 MED ORDER — IOHEXOL 300 MG/ML  SOLN
75.0000 mL | Freq: Once | INTRAMUSCULAR | Status: AC | PRN
  Administered 2018-09-17: 75 mL via INTRAVENOUS

## 2018-11-26 ENCOUNTER — Telehealth: Payer: Self-pay

## 2018-11-26 NOTE — Telephone Encounter (Signed)
Stephen Valentine , Stephen Valentine ex-wife called . ( client does not have a phone). Request transportation assistance through [facility].  [facility] scheduled and pickup times given to [name].  12/05/18 to Dr. Darrick Penna for an 0815am arrival. [name] with be around 0715am by [facility].  12/25/18 to [facility] appointment time 1:45pm [name] will be around 12:45pm.  Ms Trollinger acknowledged understanding and states she will let [name] know.   [name] RN

## 2018-12-05 ENCOUNTER — Ambulatory Visit (INDEPENDENT_AMBULATORY_CARE_PROVIDER_SITE_OTHER): Payer: Self-pay | Admitting: Gastroenterology

## 2018-12-05 ENCOUNTER — Encounter: Payer: Self-pay | Admitting: Gastroenterology

## 2018-12-05 DIAGNOSIS — K219 Gastro-esophageal reflux disease without esophagitis: Secondary | ICD-10-CM

## 2018-12-05 DIAGNOSIS — R1319 Other dysphagia: Secondary | ICD-10-CM

## 2018-12-05 DIAGNOSIS — R634 Abnormal weight loss: Secondary | ICD-10-CM

## 2018-12-05 DIAGNOSIS — R1310 Dysphagia, unspecified: Secondary | ICD-10-CM

## 2018-12-05 MED ORDER — OMEPRAZOLE 40 MG PO CPDR: 40.0000 mg | DELAYED_RELEASE_CAPSULE | Freq: Two times a day (BID) | ORAL | 3 refills | Status: AC

## 2018-12-05 NOTE — Assessment & Plan Note (Signed)
SYMPTOMS NOT IDEALLY CONTROLLED AND LIKELY DUE TOP POOR DENTITION/MASTICATION.  DISCUSSED MANAGEMENT OPTIONS. PT DECLINED BASW AT THIS TIME. Please CALL WITH QUESTIONS OR CONCERNS. If your swallowing continues to give you problems we can order a swallowing study. FOLLOW A SOFT MECHANICAL DIET.  MEATS SHOULD BE GROUND ONLY.  DO NOT EAT CHUNKS OF ANYTHING. CONTINUE OMEPRAZOLE.  TAKE 30 MINUTES PRIOR TO YOUR MEALS ONCE or TWICE DAILY. FOLLOW UP IN 6 MOS.

## 2018-12-05 NOTE — Progress Notes (Signed)
ON RECALL  °

## 2018-12-05 NOTE — Progress Notes (Signed)
Subjective:    Patient ID: Stephen Valentine, male    DOB: 03/24/1961, 58 y.o.   MRN: [id number]  Stephen Valentine, Shannon, PA-C  HPI WANTS A REFILL. RARE TIGHTNESS WHEN HE EATS:  ABOUT EVERY DAY BUT NOT AS BAD. STAYS HYPER A LOT. WAITING ON GETTING TEETH FIXED. BMs: ONCE A DAY. CONSTIPATION/RUMB,LING BUT NO LOOSE STOOL ONCE IN A WHILE. STAYS COLD. OCCASIONAL NAUSEA. HEARTBURN: BETTER. JOINTS STIFF.  PT DENIES FEVER, CHILLS, HEMATOCHEZIA, HEMATEMESIS, nausea, vomiting, melena, CHEST PAIN, SHORTNESS OF BREATH,  CHANGE IN BOWEL IN HABITS, abdominal pain, OR problems with sedation.  Past Medical History:  Diagnosis Date  . Aneurysm [facility](HCC)    patient does not know location of aneurysm  . Depression     Past Surgical History:  Procedure Laterality Date  . BIOPSY  04/17/2018   Procedure: BIOPSY;  Surgeon: West BaliFields, Laressa Bolinger L, MD;  Location: [facility];  Service: Endoscopy;;  duodenal, gastric  . COLONOSCOPY N/A 04/17/2018   Dr. Darrick Pennafields: Internal/external hemorrhoids  . ESOPHAGOGASTRODUODENOSCOPY N/A 04/17/2018   Dr. Darrick Pennafields: Web in the distal esophagus status post dilation, mild gastritis with no H. pylori on biopsy, small bowel biopsies negative for celiac disease.  Marland Kitchen. EYE SURGERY Bilateral    cataract  . MOUTH SURGERY     dental surgery  . SAVORY DILATION N/A 04/17/2018   Procedure: SAVORY DILATION;  Surgeon: West BaliFields, Koji Niehoff L, MD;  Location: [facility];  Service: Endoscopy;  Laterality: N/A;   Allergies  Allergen Reactions  . Penicillins Other (See Comments)    Unsure if he has ever taken but believes he may be allergic    Current Outpatient Medications  Medication Sig    . aspirin 325 MG tablet Take 325 mg by mouth as needed.    Marland Kitchen. omeprazole (PRILOSEC) 40 MG capsule Take 1 capsule (40 mg total) by mouth 2 (two) times daily before a meal.     Review of Systems PER HPI OTHERWISE ALL SYSTEMS ARE NEGATIVE.     Objective:   Physical Exam Vitals signs reviewed.  Constitutional:    General: He is not in acute distress.    Appearance: He is well-developed.  HENT:     Head: Normocephalic and atraumatic.     Mouth/Throat:     Mouth: Mucous membranes are moist.     Pharynx: No oropharyngeal exudate.     Comments: edentulous and caries present Eyes:     General: No scleral icterus.    Pupils: Pupils are equal, round, and reactive to light.     Comments: edenulous and caries present  Neck:     Musculoskeletal: Normal range of motion and neck supple.  Cardiovascular:     Rate and Rhythm: Normal rate and regular rhythm.     Heart sounds: Normal heart sounds.  Pulmonary:     Effort: Pulmonary effort is normal. No respiratory distress.     Breath sounds: Normal breath sounds.  Abdominal:     General: Bowel sounds are normal. There is no distension.     Palpations: Abdomen is soft.     Tenderness: There is no abdominal tenderness.  Musculoskeletal:     Right lower leg: No edema.     Left lower leg: No edema.  Lymphadenopathy:     Cervical: No cervical adenopathy.  Skin:    General: Skin is warm.  Neurological:     Mental Status: He is alert and oriented to person, place, and time. Mental status is at baseline.  Comments: NO  NEW FOCAL DEFICITS  Psychiatric:        Mood and Affect: Mood normal.        Behavior: Behavior normal.     Comments: Normal affect       Assessment & Plan:

## 2018-12-05 NOTE — Patient Instructions (Signed)
Please CALL WITH QUESTIONS OR CONCERNS. If your swallowing continues to give you problems we can order a swallowing study.    FOLLOW A SOFT MECHANICAL DIET.  MEATS SHOULD BE GROUND ONLY.  DO NOT EAT CHUNKS OF ANYTHING.   CONTINUE OMEPRAZOLE.  TAKE 30 MINUTES PRIOR TO YOUR MEALS ONCE or TWICE DAILY.  FOLLOW UP IN 6 MOS.

## 2018-12-05 NOTE — Progress Notes (Signed)
cc'ed to pcp °

## 2018-12-05 NOTE — Assessment & Plan Note (Signed)
WEIGHT UP 5 LBS SNCE OCT 2019.  CONTINUE TO MONITOR SYMPTOMS. FOLLOW UP IN 6 MOS.

## 2018-12-05 NOTE — Assessment & Plan Note (Addendum)
SYMPTOMS FAIRLY WELL CONTROLLED.  FOLLOW A SOFT MECHANICAL DIET.  MEATS SHOULD BE GROUND ONLY.  DO NOT EAT CHUNKS OF ANYTHING. CONTINUE OMEPRAZOLE.  TAKE 30 MINUTES PRIOR TO YOUR MEALS ONCE or TWICE DAILY. RX REFILLED X1 YEAR.  FOLLOW UP IN 6 MOS.

## 2018-12-25 ENCOUNTER — Encounter: Payer: Self-pay | Admitting: Physician Assistant

## 2018-12-25 ENCOUNTER — Ambulatory Visit: Payer: Self-pay | Admitting: Physician Assistant

## 2018-12-25 VITALS — BP 116/76 | HR 71 | Temp 98.1°F | Ht 69.0 in | Wt 143.0 lb

## 2018-12-25 DIAGNOSIS — E785 Hyperlipidemia, unspecified: Secondary | ICD-10-CM

## 2018-12-25 DIAGNOSIS — Z125 Encounter for screening for malignant neoplasm of prostate: Secondary | ICD-10-CM

## 2018-12-25 DIAGNOSIS — F17200 Nicotine dependence, unspecified, uncomplicated: Secondary | ICD-10-CM

## 2018-12-25 DIAGNOSIS — R630 Anorexia: Secondary | ICD-10-CM

## 2018-12-25 NOTE — Progress Notes (Signed)
BP 116/76 (BP Location: Left Arm, Patient Position: Sitting, Cuff Size: Normal)   Pulse 71   Temp 98.1 F (36.7 C)   Ht 5\' 9"  (1.753 m)   Wt 143 lb (64.9 kg)   SpO2 98%   BMI 21.12 kg/m    Subjective:    Patient ID: Stephen Valentine, male    DOB: 10/08/1961, 58 y.o.   MRN: [id number]  HPI: Stephen Valentine is a 58 y.o. male presenting on 12/25/2018 for Weight Loss   HPI  Pt is still smoking.  Pt is still seeing GI for dysphagia.  Pt says he hasn't been back to ENT.    Pt is still not eating a lot but "just nibbling" due to his teeth being bad.  He is not using boost or ensure because it is expensive.  He has gained   5 pounds since his last appointment here.   Relevant past medical, surgical, family and social history reviewed and updated as indicated. Interim medical history since our last visit reviewed. Allergies and medications reviewed and updated.    Current Outpatient Medications:  .  aspirin 325 MG tablet, Take 325 mg by mouth as needed., Disp: , Rfl:  .  omeprazole (PRILOSEC) 40 MG capsule, Take 1 capsule (40 mg total) by mouth 2 (two) times daily before a meal., Disp: 180 capsule, Rfl: 3    Review of Systems  Constitutional: Negative for appetite change, chills, diaphoresis, fatigue, fever and unexpected weight change.  HENT: Negative for congestion, dental problem, drooling, ear pain, facial swelling, hearing loss, mouth sores, sneezing, sore throat, trouble swallowing and voice change.   Eyes: Negative for pain, discharge, redness, itching and visual disturbance.  Respiratory: Negative for cough, choking, shortness of breath and wheezing.   Cardiovascular: Negative for chest pain, palpitations and leg swelling.  Gastrointestinal: Negative for abdominal pain, blood in stool, constipation, diarrhea and vomiting.  Endocrine: Negative for cold intolerance, heat intolerance and polydipsia.  Genitourinary: Negative for decreased urine volume, dysuria and  hematuria.  Musculoskeletal: Negative for arthralgias, back pain and gait problem.  Skin: Negative for rash.  Allergic/Immunologic: Negative for environmental allergies.  Neurological: Negative for seizures, syncope, light-headedness and headaches.  Hematological: Negative for adenopathy.  Psychiatric/Behavioral: Negative for agitation, dysphoric mood and suicidal ideas. The patient is not nervous/anxious.     Per HPI unless specifically indicated above     Objective:    BP 116/76 (BP Location: Left Arm, Patient Position: Sitting, Cuff Size: Normal)   Pulse 71   Temp 98.1 F (36.7 C)   Ht 5\' 9"  (1.753 m)   Wt 143 lb (64.9 kg)   SpO2 98%   BMI 21.12 kg/m   Wt Readings from Last 3 Encounters:  12/25/18 143 lb (64.9 kg)  12/05/18 140 lb 9.6 oz (63.8 kg)  08/26/18 138 lb 8 oz (62.8 kg)    Physical Exam Vitals signs reviewed.  Constitutional:      Appearance: He is well-developed.  HENT:     Head: Normocephalic and atraumatic.  Neck:     Musculoskeletal: Neck supple.  Cardiovascular:     Rate and Rhythm: Normal rate and regular rhythm.  Pulmonary:     Effort: Pulmonary effort is normal.     Breath sounds: Normal breath sounds. No wheezing.  Abdominal:     General: Bowel sounds are normal.     Palpations: Abdomen is soft.     Tenderness: There is no abdominal tenderness.  Lymphadenopathy:  Cervical: No cervical adenopathy.  Skin:    General: Skin is warm and dry.  Neurological:     Mental Status: He is alert and oriented to person, place, and time.  Psychiatric:        Behavior: Behavior normal.         Assessment & Plan:   Encounter Diagnoses  Name Primary?  . Tobacco use disorder Yes  . Poor appetite   . Hyperlipidemia, unspecified hyperlipidemia type   . Screening for prostate cancer     -Will check labs and call pt with results/Pt is due to recheck lipids, psa -encouraged pt to continue eating frequently and supplements like boost or ensure as he  can afford -counseled smoking cessation -Pt is on dental list (he wants to have some teeth pulled) -pt to Follow up 6 months.  RTO sooner prn

## 2018-12-31 ENCOUNTER — Other Ambulatory Visit (HOSPITAL_COMMUNITY)
Admission: RE | Admit: 2018-12-31 | Discharge: 2018-12-31 | Disposition: A | Discharge status: Home / Self Care | Payer: Self-pay | Source: Ambulatory Visit | Attending: Physician Assistant | Admitting: Physician Assistant

## 2018-12-31 DIAGNOSIS — Z125 Encounter for screening for malignant neoplasm of prostate: Secondary | ICD-10-CM

## 2018-12-31 DIAGNOSIS — E785 Hyperlipidemia, unspecified: Secondary | ICD-10-CM

## 2018-12-31 LAB — COMPREHENSIVE METABOLIC PANEL
ALBUMIN: 4 g/dL (ref 3.5–5.0)
ALK PHOS: 54 U/L (ref 38–126)
ALT: 37 U/L (ref 0–44)
AST: 30 U/L (ref 15–41)
Anion gap: 7 (ref 5–15)
BUN: 10 mg/dL (ref 6–20)
CALCIUM: 9.4 mg/dL (ref 8.9–10.3)
CO2: 29 mmol/L (ref 22–32)
CREATININE: 1.02 mg/dL (ref 0.61–1.24)
Chloride: 107 mmol/L (ref 98–111)
GFR calc Af Amer: >60 mL/min (ref >60)
GFR calc non Af Amer: >60 mL/min (ref >60)
GLUCOSE: 85 mg/dL (ref 70–99)
Potassium: 4.4 mmol/L (ref 3.5–5.1)
Sodium: 143 mmol/L (ref 135–145)
Total Bilirubin: 0.8 mg/dL (ref 0.3–1.2)
Total Protein: 7.1 g/dL (ref 6.5–8.1)

## 2018-12-31 LAB — LIPID PANEL
Cholesterol: 207 mg/dL — ABNORMAL HIGH (ref 0–200)
HDL: 62 mg/dL (ref >40)
LDL Cholesterol: 131 mg/dL — ABNORMAL HIGH (ref 0–99)
Total CHOL/HDL Ratio: 3.3 RATIO
Triglycerides: 68 mg/dL (ref <150)
VLDL: 14 mg/dL (ref 0–40)

## 2018-12-31 LAB — PSA: Prostatic Specific Antigen: 0.44 ng/mL (ref 0.00–4.00)

## 2019-04-29 ENCOUNTER — Encounter: Payer: Self-pay | Admitting: Gastroenterology

## 2019-06-06 IMAGING — CT CT ABD-PELV W/ CM
2 of 5 series · 15 of 46 positions shown, 17 images · IV contrast (iopamidol)
Comparison: None.

CLINICAL DATA: Low back pain, pelvic pain and weight loss.

EXAM:
CT ABDOMEN AND PELVIS WITH CONTRAST
TECHNIQUE: Multidetector CT imaging of the abdomen and pelvis was performed
using the standard protocol following bolus administration of
intravenous contrast.
CONTRAST:  100mL R7VGRF-WYY IOPAMIDOL (R7VGRF-WYY) INJECTION 61%

[Series 2: axial st · axial · 0.61mm/px · z∈[-500,-130]mm · 12 of 84 slices shown, 14 images]
[im 5/84  soft-tissue]
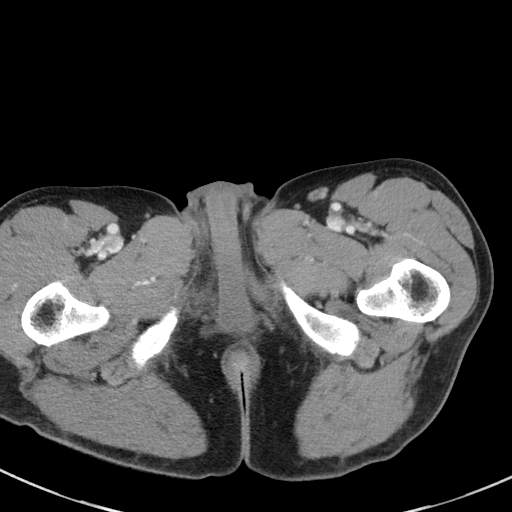
[im 5/84  bone]
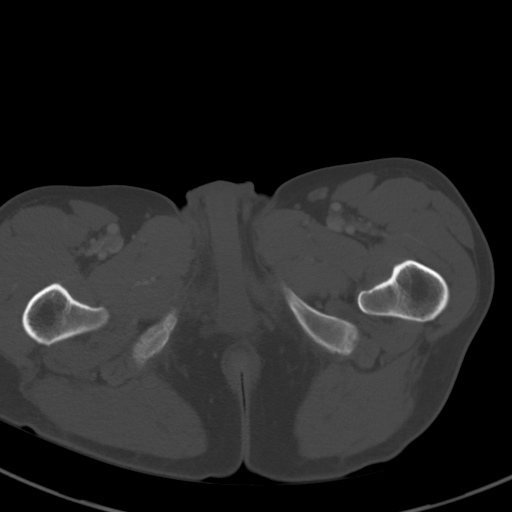
[im 14/84  soft-tissue]
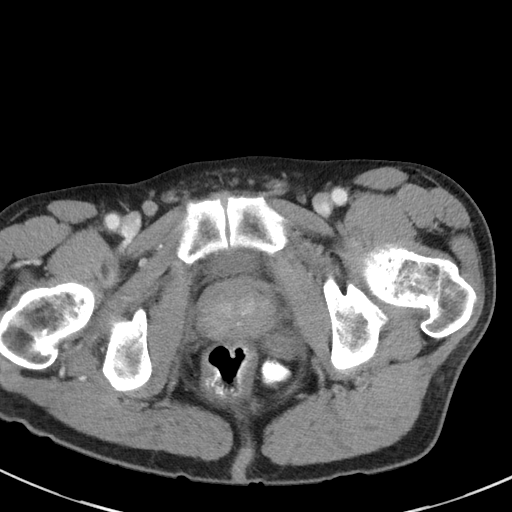
[im 18/84  soft-tissue]
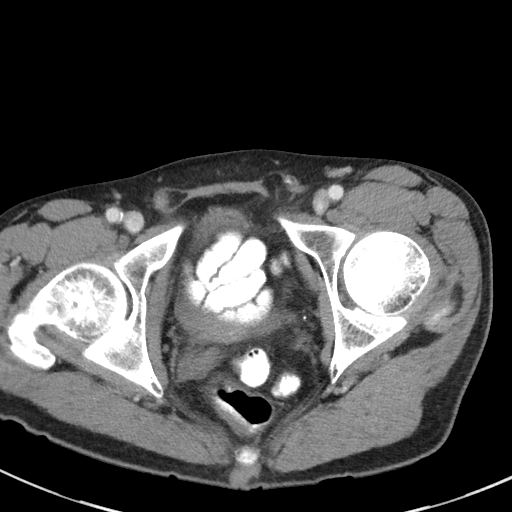
[im 27/84  soft-tissue]
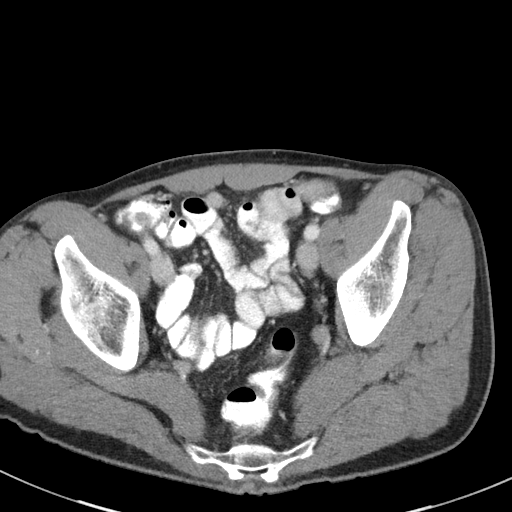
[im 31/84  soft-tissue]
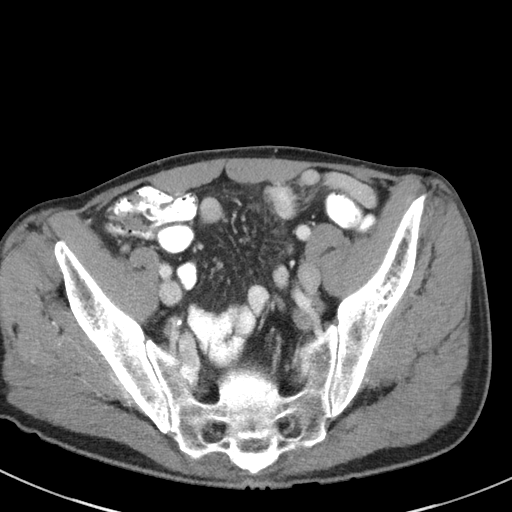
[im 40/84  soft-tissue]
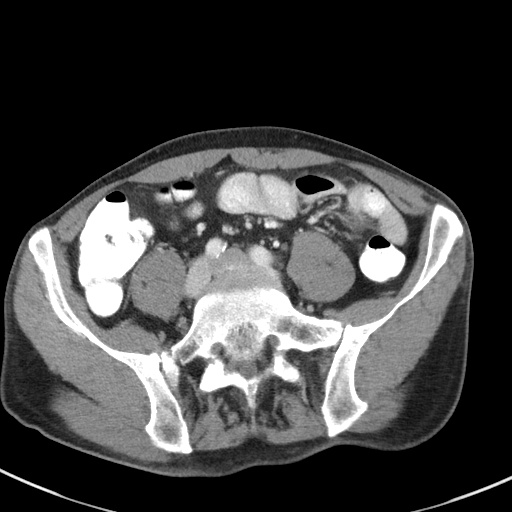
[im 44/84  soft-tissue]
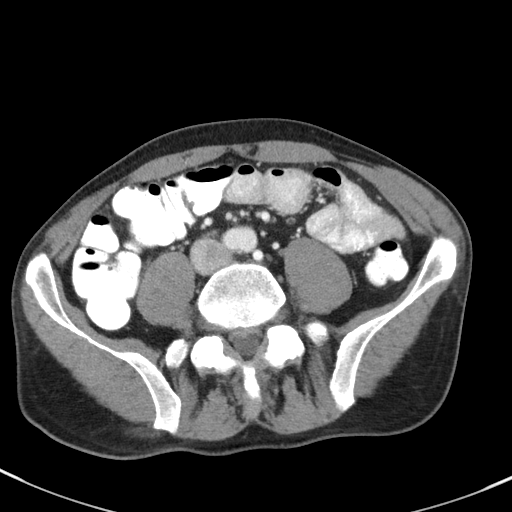
[im 53/84  soft-tissue]
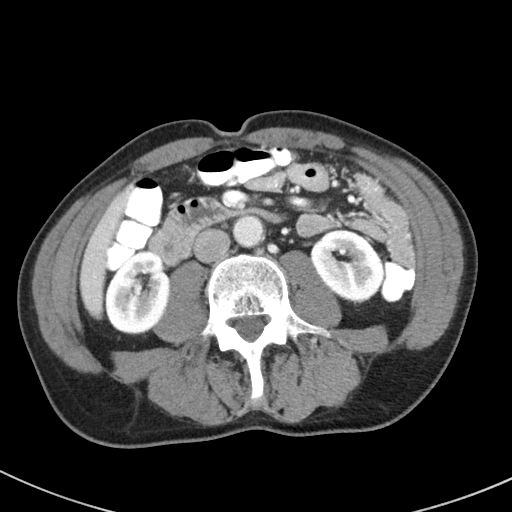
[im 57/84  soft-tissue]
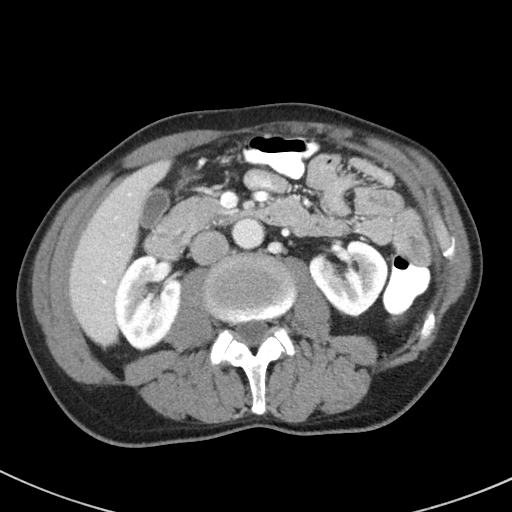
[im 57/84  bone]
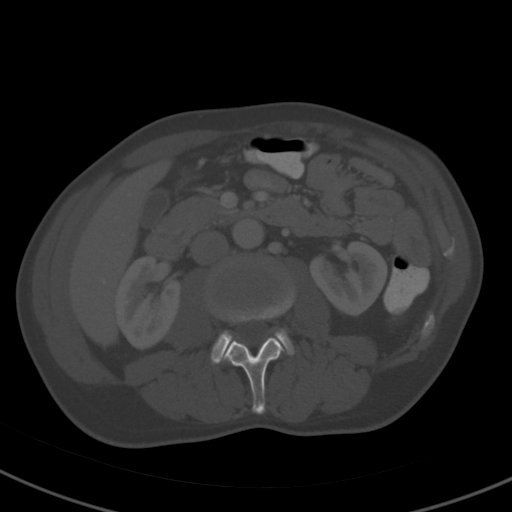
[im 66/84  soft-tissue]
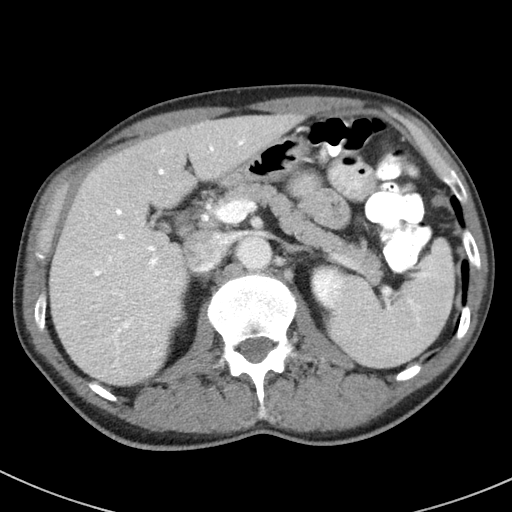
[im 70/84  soft-tissue]
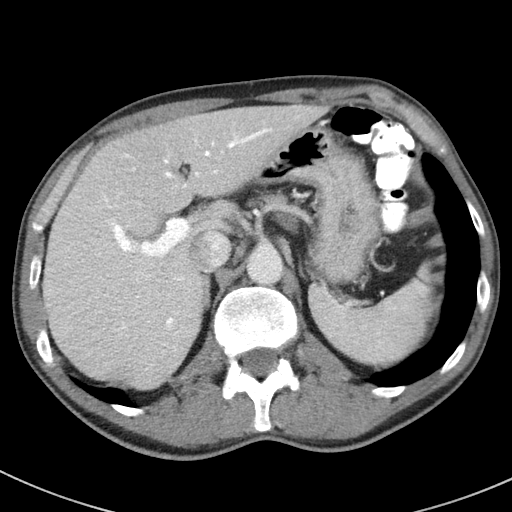
[im 79/84  soft-tissue]
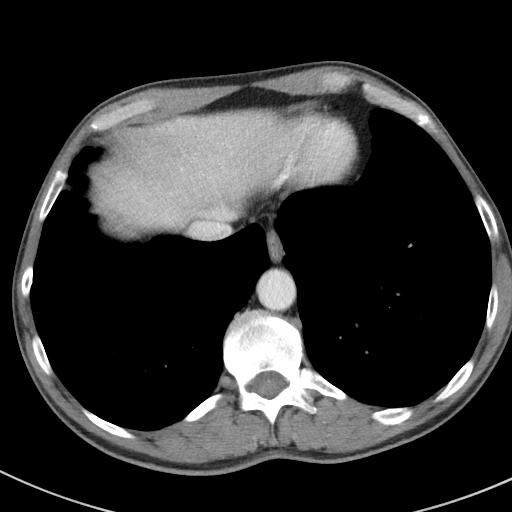

[Series 5: coronal st · coronal · 0.58mm/px · 3 of 77 slices shown]
[im 26/77  soft-tissue]
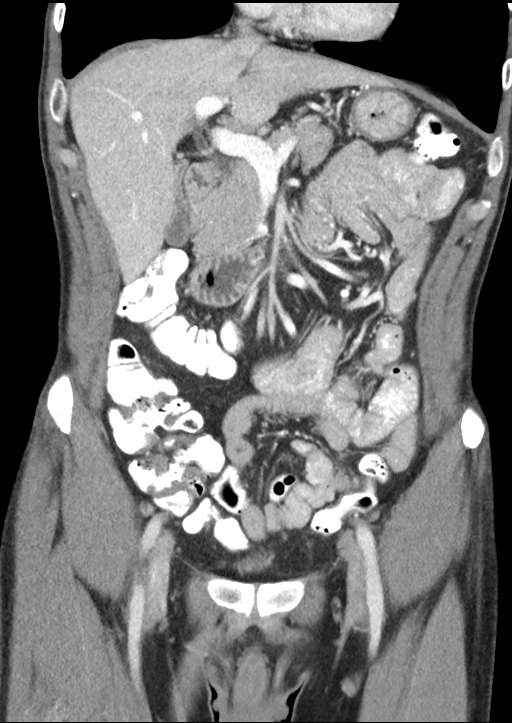
[im 34/77  soft-tissue]
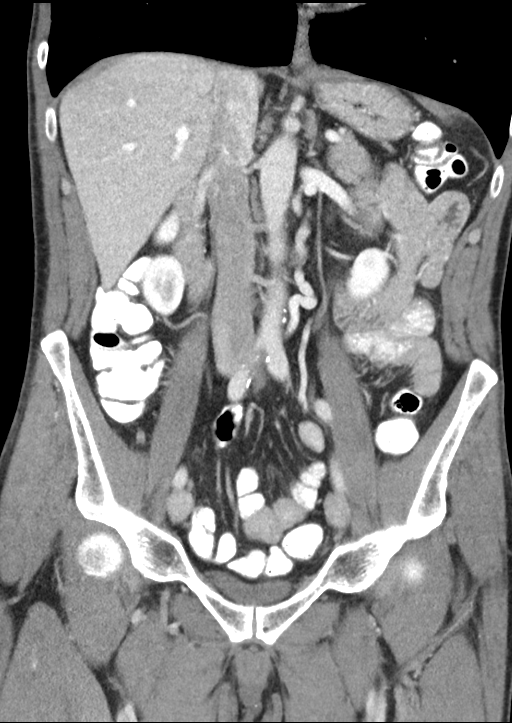
[im 43/77  soft-tissue]
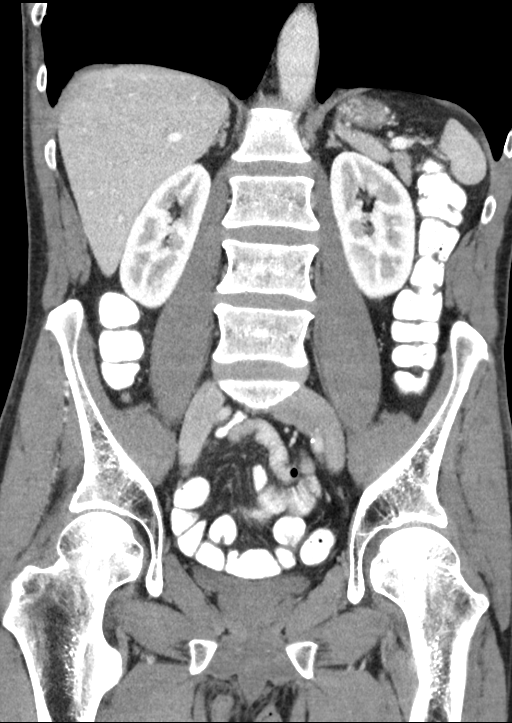

[15 of 46 positions shown; findings below may reference images not displayed]

FINDINGS: Lower chest: The lung bases are clear of acute process. No pleural
effusion or pulmonary lesions. The heart is normal in size. No
pericardial effusion. The distal esophagus and aorta are
unremarkable.

Hepatobiliary: No focal hepatic lesions or intrahepatic biliary
dilatation. The gallbladder is normal. No common bile duct
dilatation.

Pancreas: No mass, inflammation or ductal dilatation.

Spleen: Normal size.  No focal lesions.

Adrenals/Urinary Tract: The adrenal glands and kidneys are
unremarkable. No renal, ureteral or bladder calculi or mass.

Stomach/Bowel: The stomach, duodenum, small bowel and colon are
unremarkable. No acute inflammatory changes, mass lesions or
obstructive findings. Jejunal intussusception is noted in the left
upper quadrant best seen on the coronal image 26. No mass or lead
point. This is likely a benign transient abnormality.

The terminal ileum and appendix are normal.

There is mild apparent rectal wall thickening. This may be due to
internal hemorrhoids noted on recent colonoscopy.

Vascular/Lymphatic: The aorta is normal in caliber. No dissection.
Scattered aortic and iliac artery calcifications. The branch vessels
are patent. The major venous structures are patent. No mesenteric or
retroperitoneal mass or adenopathy. Small scattered lymph nodes are
noted.

Reproductive: The prostate gland and seminal vesicles are
unremarkable.

Other: No pelvic mass or adenopathy. No free pelvic fluid
collections. No inguinal mass or adenopathy. No abdominal wall
hernia or subcutaneous lesions.

Musculoskeletal: No significant bony findings. There is partial
sacralization of L5 noted on the left side.
IMPRESSION: 1. No acute abdominal/pelvic findings, mass lesions or adenopathy.
2. Rectal wall thickening likely due to internal hemorrhoids noted
on recent colonoscopy.
3. Jejunal intussusception, likely benign transient process. No mass
or obstruction.

## 2019-06-17 ENCOUNTER — Telehealth: Payer: Self-pay

## 2019-06-17 NOTE — Telephone Encounter (Signed)
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  Client [facility]. Client does not have reliable transportation. [facility] arranged to pick client up one hour prior to his appointment and return client home after his appointment.  [facility] office manager Vincente Liberty Left message with Mr. Soto contact with above information.  [place] [facility] [place]

## 2019-06-25 ENCOUNTER — Ambulatory Visit: Payer: Self-pay | Admitting: Physician Assistant

## 2019-10-23 IMAGING — CT CT CHEST W/ CM
2 of 3 series · 15 of 36 positions shown, 18 images · IV contrast (omnipaque)
Comparison: Chest radiograph 04/25/2018

CLINICAL DATA: Unexplained weight loss for 1 year, smoker

EXAM:
CT CHEST WITH CONTRAST
TECHNIQUE: Multidetector CT imaging of the chest was performed during
intravenous contrast administration. Sagittal and coronal MPR images
reconstructed from axial data set.
CONTRAST:  75mL OMNIPAQUE IOHEXOL 300 MG/ML  SOLN IV

[Series 2: axial st · axial · 0.71mm/px · z∈[+1154,+1464]mm · 12 of 183 slices shown, 15 images]
[im 14/183  mediastinal]
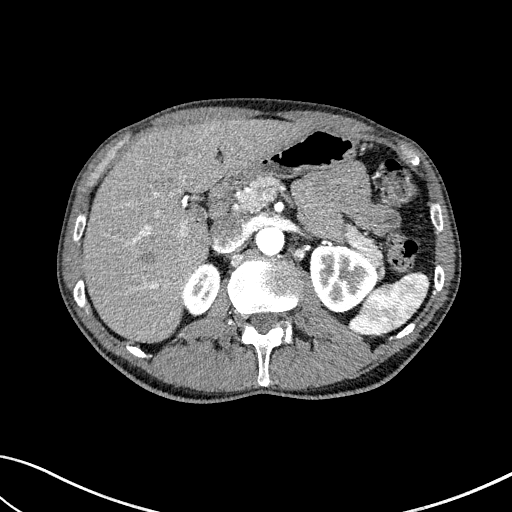
[im 14/183  lung]
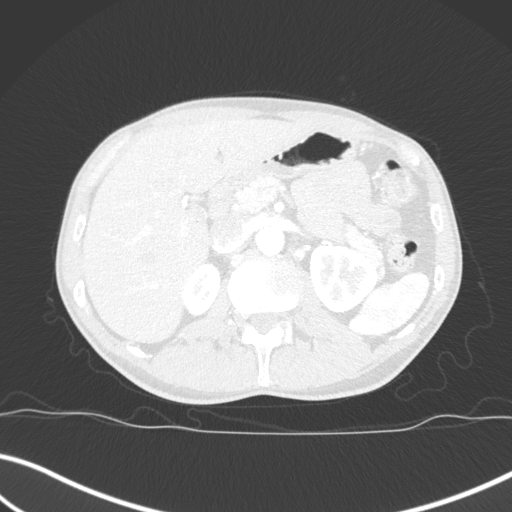
[im 27/183  lung]
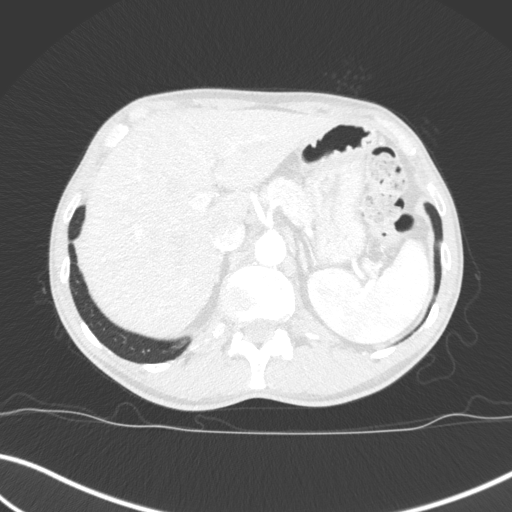
[im 41/183  lung]
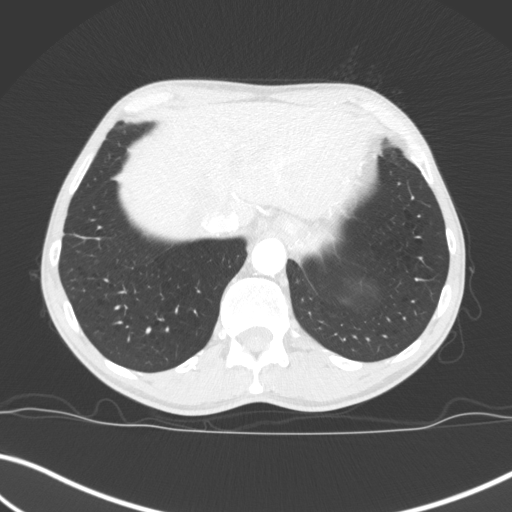
[im 54/183  lung]
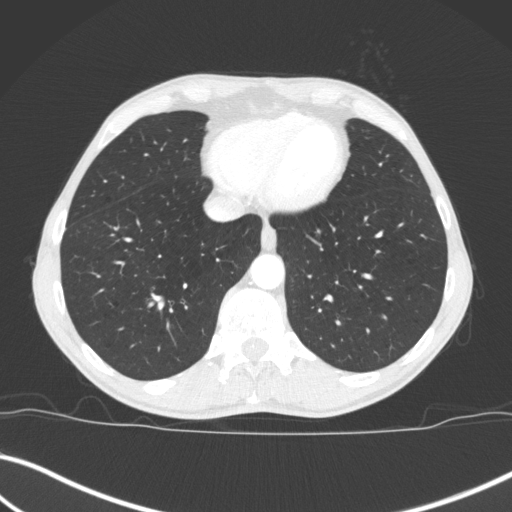
[im 68/183  mediastinal]
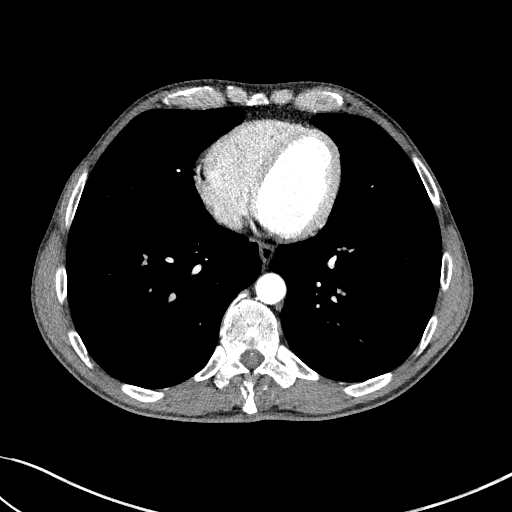
[im 68/183  lung]
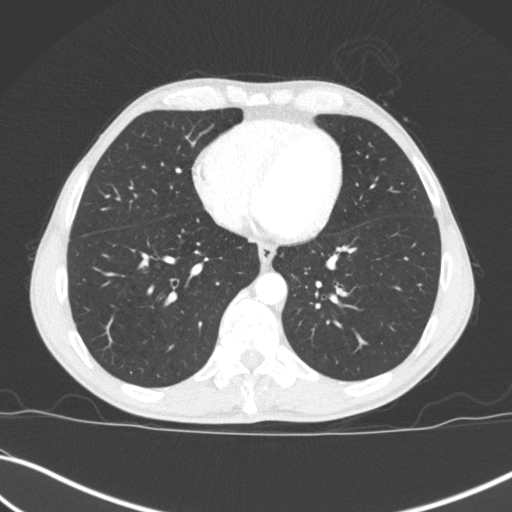
[im 81/183  lung]
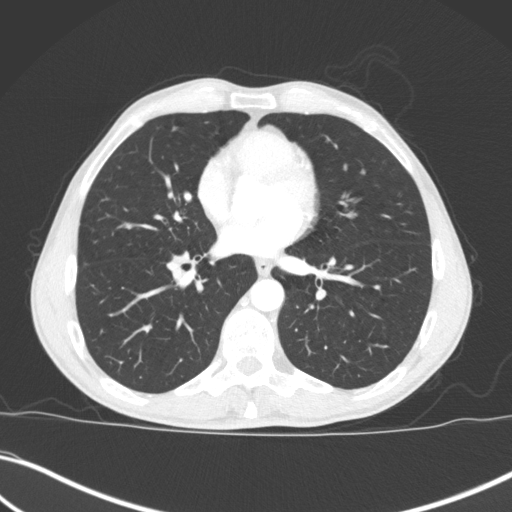
[im 102/183  lung]
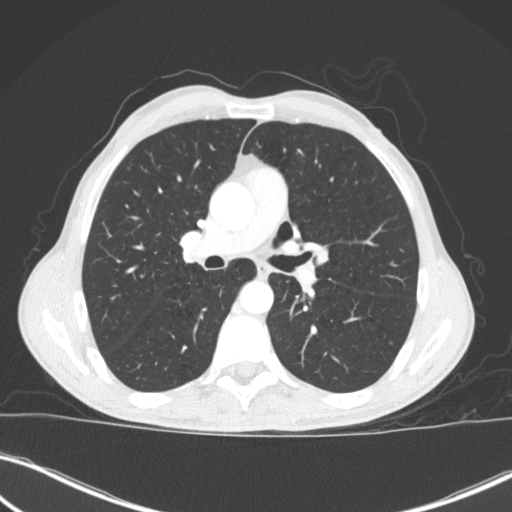
[im 115/183  lung]
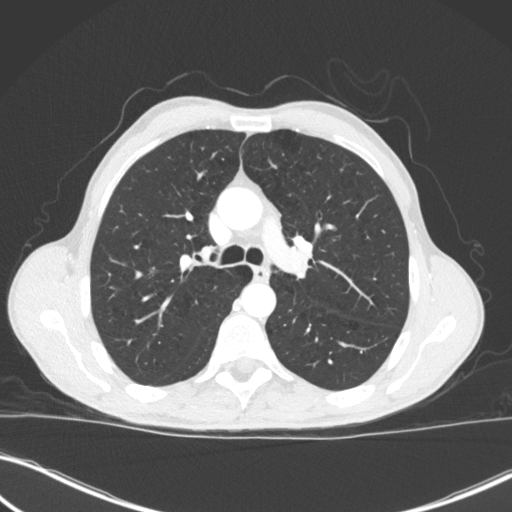
[im 129/183  mediastinal]
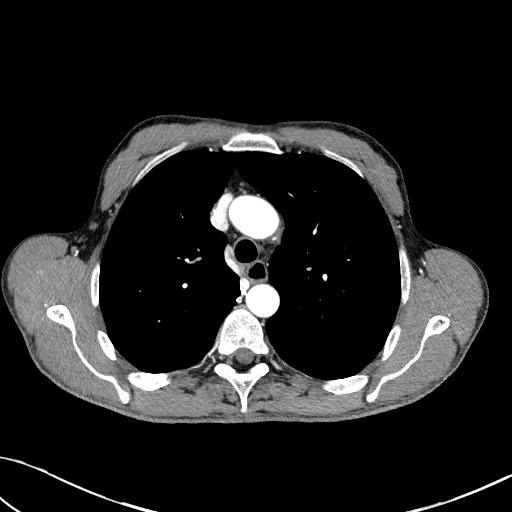
[im 129/183  lung]
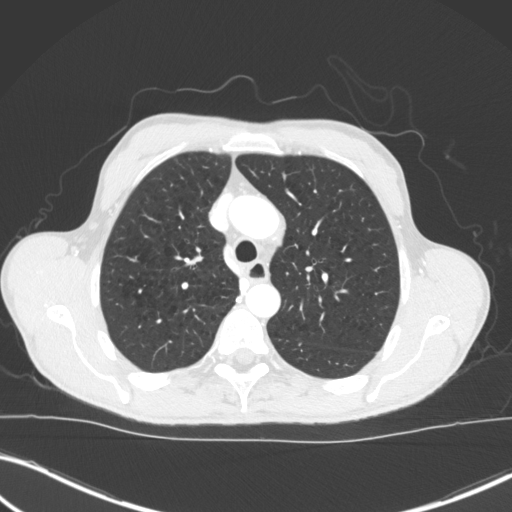
[im 142/183  lung]
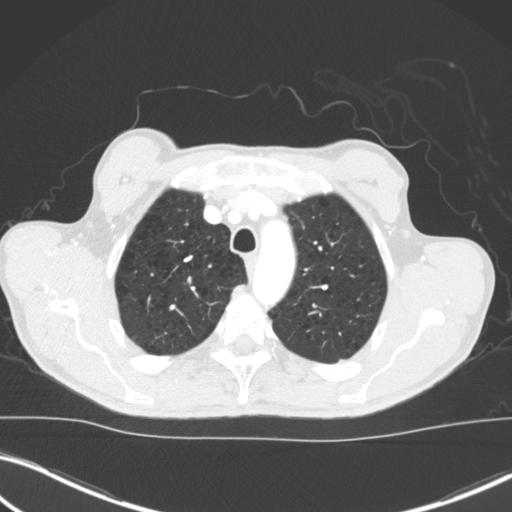
[im 156/183  lung]
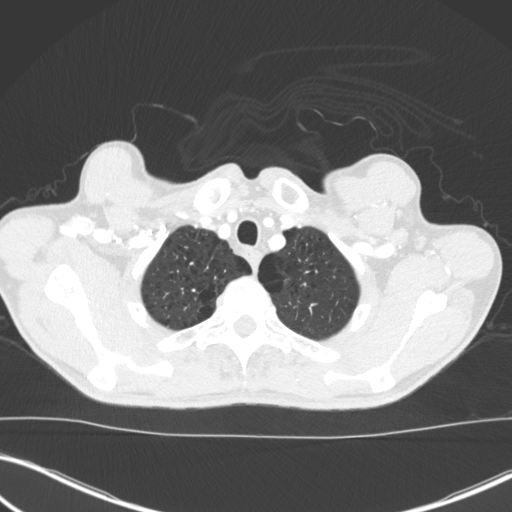
[im 169/183  lung]
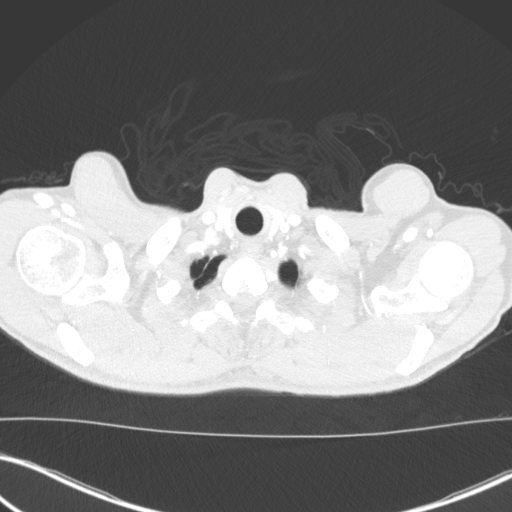

[Series 5: coronal · coronal · 0.72mm/px · 3 of 131 slices shown]
[im 27/131  lung]
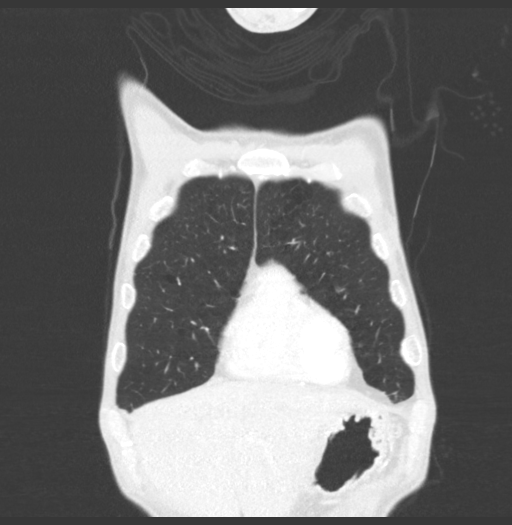
[im 53/131  lung]
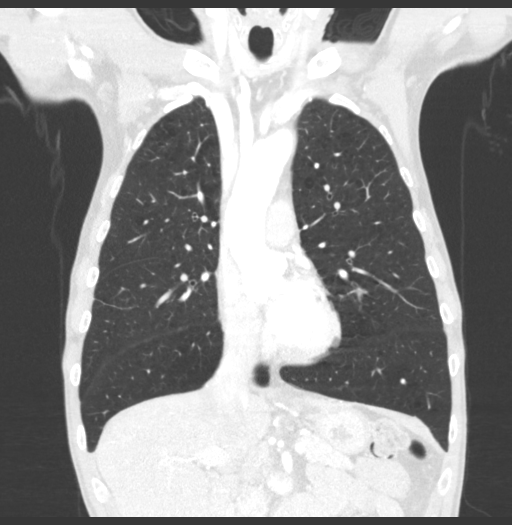
[im 79/131  lung]
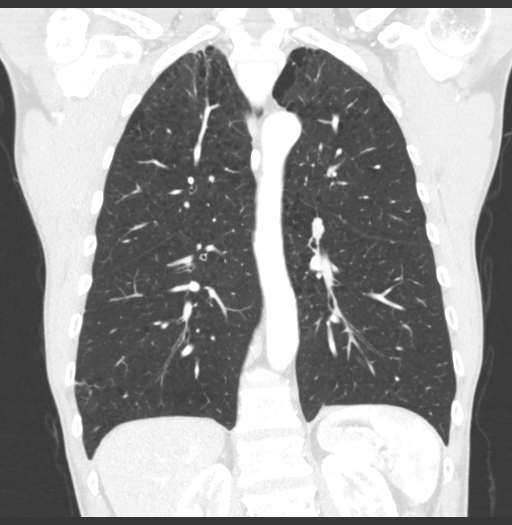

[15 of 36 positions shown; findings below may reference images not displayed]

FINDINGS: Cardiovascular: Scattered atherosclerotic calcifications aorta.
Aorta normal caliber without aneurysm. Heart unremarkable. No
pericardial effusion.

Mediastinum/Nodes: Esophagus normal appearance. Base of cervical
region normal appearance. Scattered normal size mediastinal lymph
nodes without thoracic adenopathy.

Lungs/Pleura: Scattered peribronchial thickening. Emphysematous
changes greatest in upper lobes with several small subpleural blebs
at the apices. 4 mm RIGHT middle lobe nodule image 107, partially
calcified likely granuloma. Additional calcified granulomas at LEFT
major fissure image 79 and at periphery of LEFT lower lobe image
137. No acute infiltrate, pleural effusion, pneumothorax or tumor.

Upper Abdomen: Unremarkable

Musculoskeletal: No bone lesions.
IMPRESSION: Changes of COPD and old granulomatous disease.

No acute intrathoracic abnormalities.

Aortic Atherosclerosis (IOIWD-CTO.O) and Emphysema (IOIWD-W6V.V).
